# Patient Record
Sex: Female | Born: 1965
Health system: Southern US, Community
[De-identification: ages and names within clinical notes are randomized; demographics above are authoritative.]

## PROBLEM LIST (undated history)

## (undated) DIAGNOSIS — F329 Major depressive disorder, single episode, unspecified: Secondary | ICD-10-CM

## (undated) DIAGNOSIS — M1990 Unspecified osteoarthritis, unspecified site: Secondary | ICD-10-CM

## (undated) DIAGNOSIS — F32A Depression, unspecified: Secondary | ICD-10-CM

## (undated) HISTORY — PX: JOINT REPLACEMENT: SHX530

## (undated) HISTORY — PX: ABDOMINAL HYSTERECTOMY: SHX81

---

## 1997-12-01 ENCOUNTER — Ambulatory Visit (HOSPITAL_COMMUNITY): Admission: RE | Admit: 1997-12-01 | Discharge: 1997-12-01 | Payer: Self-pay | Admitting: Obstetrics and Gynecology

## 1999-02-07 ENCOUNTER — Other Ambulatory Visit: Admission: RE | Admit: 1999-02-07 | Discharge: 1999-02-07 | Payer: Self-pay | Admitting: Gynecology

## 1999-03-19 ENCOUNTER — Encounter (INDEPENDENT_AMBULATORY_CARE_PROVIDER_SITE_OTHER): Payer: Self-pay | Admitting: Specialist

## 1999-03-19 ENCOUNTER — Other Ambulatory Visit: Admission: RE | Admit: 1999-03-19 | Discharge: 1999-03-19 | Payer: Self-pay | Admitting: Gynecology

## 2000-09-11 ENCOUNTER — Other Ambulatory Visit: Admission: RE | Admit: 2000-09-11 | Discharge: 2000-09-11 | Payer: Self-pay | Admitting: *Deleted

## 2001-09-24 ENCOUNTER — Other Ambulatory Visit: Admission: RE | Admit: 2001-09-24 | Discharge: 2001-09-24 | Payer: Self-pay | Admitting: *Deleted

## 2002-01-12 ENCOUNTER — Encounter: Admission: RE | Admit: 2002-01-12 | Discharge: 2002-01-12 | Payer: Self-pay | Admitting: Family Medicine

## 2002-01-12 ENCOUNTER — Encounter: Payer: Self-pay | Admitting: Family Medicine

## 2002-09-15 ENCOUNTER — Other Ambulatory Visit: Admission: RE | Admit: 2002-09-15 | Discharge: 2002-09-15 | Payer: Self-pay | Admitting: *Deleted

## 2002-11-25 ENCOUNTER — Encounter: Payer: Self-pay | Admitting: Gastroenterology

## 2002-11-25 ENCOUNTER — Encounter: Admission: RE | Admit: 2002-11-25 | Discharge: 2002-11-25 | Payer: Self-pay | Admitting: Gastroenterology

## 2004-07-26 ENCOUNTER — Encounter: Admission: RE | Admit: 2004-07-26 | Discharge: 2004-07-26 | Payer: Self-pay | Admitting: Family Medicine

## 2004-08-02 ENCOUNTER — Encounter: Admission: RE | Admit: 2004-08-02 | Discharge: 2004-08-02 | Payer: Self-pay | Admitting: Family Medicine

## 2005-10-07 ENCOUNTER — Encounter: Admission: RE | Admit: 2005-10-07 | Discharge: 2005-10-07 | Payer: Self-pay | Admitting: Obstetrics and Gynecology

## 2009-01-12 ENCOUNTER — Encounter: Admission: RE | Admit: 2009-01-12 | Discharge: 2009-01-12 | Payer: Self-pay | Admitting: Obstetrics and Gynecology

## 2009-12-31 ENCOUNTER — Encounter: Admission: RE | Admit: 2009-12-31 | Discharge: 2009-12-31 | Payer: Self-pay | Admitting: Obstetrics and Gynecology

## 2010-12-20 ENCOUNTER — Other Ambulatory Visit: Payer: Self-pay | Admitting: Obstetrics and Gynecology

## 2010-12-20 DIAGNOSIS — Z1231 Encounter for screening mammogram for malignant neoplasm of breast: Secondary | ICD-10-CM

## 2011-01-03 ENCOUNTER — Ambulatory Visit: Payer: Self-pay

## 2011-01-09 ENCOUNTER — Other Ambulatory Visit: Payer: Self-pay | Admitting: Gastroenterology

## 2011-01-10 ENCOUNTER — Ambulatory Visit
Admission: RE | Admit: 2011-01-10 | Discharge: 2011-01-10 | Disposition: A | Discharge status: Home / Self Care | Payer: 59 | Source: Ambulatory Visit | Attending: Gastroenterology | Admitting: Gastroenterology

## 2011-01-10 MED ORDER — IOHEXOL 300 MG/ML  SOLN
100.0000 mL | Freq: Once | INTRAMUSCULAR | Status: AC | PRN
  Administered 2011-01-10: 100 mL via INTRAVENOUS

## 2011-01-13 ENCOUNTER — Other Ambulatory Visit: Payer: Self-pay | Admitting: Gastroenterology

## 2011-01-13 DIAGNOSIS — R1032 Left lower quadrant pain: Secondary | ICD-10-CM

## 2011-01-17 ENCOUNTER — Ambulatory Visit
Admission: RE | Admit: 2011-01-17 | Discharge: 2011-01-17 | Disposition: A | Discharge status: Home / Self Care | Payer: 59 | Source: Ambulatory Visit | Attending: Gastroenterology | Admitting: Gastroenterology

## 2011-01-17 DIAGNOSIS — R1032 Left lower quadrant pain: Secondary | ICD-10-CM

## 2011-01-31 ENCOUNTER — Ambulatory Visit
Admission: RE | Admit: 2011-01-31 | Discharge: 2011-01-31 | Disposition: A | Discharge status: Home / Self Care | Payer: 59 | Source: Ambulatory Visit | Attending: Obstetrics and Gynecology | Admitting: Obstetrics and Gynecology

## 2011-01-31 DIAGNOSIS — Z1231 Encounter for screening mammogram for malignant neoplasm of breast: Secondary | ICD-10-CM

## 2012-01-14 ENCOUNTER — Other Ambulatory Visit: Payer: Self-pay | Admitting: Obstetrics and Gynecology

## 2012-01-14 DIAGNOSIS — Z1231 Encounter for screening mammogram for malignant neoplasm of breast: Secondary | ICD-10-CM

## 2012-02-02 ENCOUNTER — Ambulatory Visit
Admission: RE | Admit: 2012-02-02 | Discharge: 2012-02-02 | Disposition: A | Discharge status: Home / Self Care | Payer: 59 | Source: Ambulatory Visit | Attending: Obstetrics and Gynecology | Admitting: Obstetrics and Gynecology

## 2012-02-02 DIAGNOSIS — Z1231 Encounter for screening mammogram for malignant neoplasm of breast: Secondary | ICD-10-CM

## 2012-02-09 ENCOUNTER — Other Ambulatory Visit: Payer: Self-pay | Admitting: Obstetrics and Gynecology

## 2012-02-09 DIAGNOSIS — R928 Other abnormal and inconclusive findings on diagnostic imaging of breast: Secondary | ICD-10-CM

## 2012-02-13 ENCOUNTER — Ambulatory Visit
Admission: RE | Admit: 2012-02-13 | Discharge: 2012-02-13 | Disposition: A | Discharge status: Home / Self Care | Payer: 59 | Source: Ambulatory Visit | Attending: Obstetrics and Gynecology | Admitting: Obstetrics and Gynecology

## 2012-02-13 DIAGNOSIS — R928 Other abnormal and inconclusive findings on diagnostic imaging of breast: Secondary | ICD-10-CM

## 2013-01-14 ENCOUNTER — Other Ambulatory Visit: Payer: Self-pay

## 2013-01-14 DIAGNOSIS — Z1231 Encounter for screening mammogram for malignant neoplasm of breast: Secondary | ICD-10-CM

## 2013-02-03 ENCOUNTER — Ambulatory Visit: Payer: 59

## 2013-03-04 ENCOUNTER — Other Ambulatory Visit: Payer: Self-pay | Admitting: Obstetrics and Gynecology

## 2013-03-04 ENCOUNTER — Ambulatory Visit
Admission: RE | Admit: 2013-03-04 | Discharge: 2013-03-04 | Disposition: A | Discharge status: Home / Self Care | Payer: 59 | Source: Ambulatory Visit

## 2013-03-04 DIAGNOSIS — Z1231 Encounter for screening mammogram for malignant neoplasm of breast: Secondary | ICD-10-CM

## 2013-03-04 DIAGNOSIS — N644 Mastodynia: Secondary | ICD-10-CM

## 2013-04-08 ENCOUNTER — Ambulatory Visit
Admission: RE | Admit: 2013-04-08 | Discharge: 2013-04-08 | Disposition: A | Discharge status: Home / Self Care | Payer: 59 | Source: Ambulatory Visit | Attending: Obstetrics and Gynecology | Admitting: Obstetrics and Gynecology

## 2013-04-08 ENCOUNTER — Other Ambulatory Visit: Payer: Self-pay | Admitting: Obstetrics and Gynecology

## 2013-04-08 DIAGNOSIS — N644 Mastodynia: Secondary | ICD-10-CM

## 2014-05-31 ENCOUNTER — Other Ambulatory Visit: Payer: Self-pay

## 2014-05-31 DIAGNOSIS — Z1231 Encounter for screening mammogram for malignant neoplasm of breast: Secondary | ICD-10-CM

## 2014-06-29 ENCOUNTER — Ambulatory Visit
Admission: RE | Admit: 2014-06-29 | Discharge: 2014-06-29 | Disposition: A | Discharge status: Home / Self Care | Payer: 59 | Source: Ambulatory Visit

## 2014-06-29 DIAGNOSIS — Z1231 Encounter for screening mammogram for malignant neoplasm of breast: Secondary | ICD-10-CM

## 2015-07-31 ENCOUNTER — Other Ambulatory Visit: Payer: Self-pay

## 2015-07-31 DIAGNOSIS — Z1231 Encounter for screening mammogram for malignant neoplasm of breast: Secondary | ICD-10-CM

## 2015-08-24 ENCOUNTER — Ambulatory Visit
Admission: RE | Admit: 2015-08-24 | Discharge: 2015-08-24 | Disposition: A | Discharge status: Home / Self Care | Payer: 59 | Source: Ambulatory Visit

## 2015-08-24 DIAGNOSIS — Z1231 Encounter for screening mammogram for malignant neoplasm of breast: Secondary | ICD-10-CM

## 2016-08-01 DIAGNOSIS — M5013 Cervical disc disorder with radiculopathy, cervicothoracic region: Secondary | ICD-10-CM | POA: Diagnosis not present

## 2016-08-01 DIAGNOSIS — M542 Cervicalgia: Secondary | ICD-10-CM | POA: Diagnosis not present

## 2016-08-01 DIAGNOSIS — S138XXA Sprain of joints and ligaments of other parts of neck, initial encounter: Secondary | ICD-10-CM | POA: Diagnosis not present

## 2016-09-03 DIAGNOSIS — M542 Cervicalgia: Secondary | ICD-10-CM | POA: Diagnosis not present

## 2016-09-03 DIAGNOSIS — M5013 Cervical disc disorder with radiculopathy, cervicothoracic region: Secondary | ICD-10-CM | POA: Diagnosis not present

## 2016-09-03 DIAGNOSIS — S138XXA Sprain of joints and ligaments of other parts of neck, initial encounter: Secondary | ICD-10-CM | POA: Diagnosis not present

## 2016-10-01 DIAGNOSIS — M5013 Cervical disc disorder with radiculopathy, cervicothoracic region: Secondary | ICD-10-CM | POA: Diagnosis not present

## 2016-10-01 DIAGNOSIS — S138XXA Sprain of joints and ligaments of other parts of neck, initial encounter: Secondary | ICD-10-CM | POA: Diagnosis not present

## 2016-10-01 DIAGNOSIS — M542 Cervicalgia: Secondary | ICD-10-CM | POA: Diagnosis not present

## 2016-11-05 DIAGNOSIS — S138XXA Sprain of joints and ligaments of other parts of neck, initial encounter: Secondary | ICD-10-CM | POA: Diagnosis not present

## 2016-11-05 DIAGNOSIS — M542 Cervicalgia: Secondary | ICD-10-CM | POA: Diagnosis not present

## 2016-11-05 DIAGNOSIS — M5013 Cervical disc disorder with radiculopathy, cervicothoracic region: Secondary | ICD-10-CM | POA: Diagnosis not present

## 2016-12-18 DIAGNOSIS — M542 Cervicalgia: Secondary | ICD-10-CM | POA: Diagnosis not present

## 2016-12-18 DIAGNOSIS — M5013 Cervical disc disorder with radiculopathy, cervicothoracic region: Secondary | ICD-10-CM | POA: Diagnosis not present

## 2016-12-18 DIAGNOSIS — S138XXA Sprain of joints and ligaments of other parts of neck, initial encounter: Secondary | ICD-10-CM | POA: Diagnosis not present

## 2017-01-16 DIAGNOSIS — S138XXA Sprain of joints and ligaments of other parts of neck, initial encounter: Secondary | ICD-10-CM | POA: Diagnosis not present

## 2017-01-16 DIAGNOSIS — M542 Cervicalgia: Secondary | ICD-10-CM | POA: Diagnosis not present

## 2017-01-16 DIAGNOSIS — M5013 Cervical disc disorder with radiculopathy, cervicothoracic region: Secondary | ICD-10-CM | POA: Diagnosis not present

## 2017-02-13 DIAGNOSIS — Z6823 Body mass index (BMI) 23.0-23.9, adult: Secondary | ICD-10-CM | POA: Diagnosis not present

## 2017-02-13 DIAGNOSIS — Z01419 Encounter for gynecological examination (general) (routine) without abnormal findings: Secondary | ICD-10-CM | POA: Diagnosis not present

## 2017-02-19 DIAGNOSIS — M542 Cervicalgia: Secondary | ICD-10-CM | POA: Diagnosis not present

## 2017-02-19 DIAGNOSIS — M5013 Cervical disc disorder with radiculopathy, cervicothoracic region: Secondary | ICD-10-CM | POA: Diagnosis not present

## 2017-02-19 DIAGNOSIS — S138XXA Sprain of joints and ligaments of other parts of neck, initial encounter: Secondary | ICD-10-CM | POA: Diagnosis not present

## 2017-03-04 ENCOUNTER — Other Ambulatory Visit: Payer: Self-pay | Admitting: Obstetrics and Gynecology

## 2017-03-04 DIAGNOSIS — Z1231 Encounter for screening mammogram for malignant neoplasm of breast: Secondary | ICD-10-CM

## 2017-03-26 ENCOUNTER — Ambulatory Visit
Admission: RE | Admit: 2017-03-26 | Discharge: 2017-03-26 | Disposition: A | Discharge status: Home / Self Care | Payer: 59 | Source: Ambulatory Visit | Attending: Obstetrics and Gynecology | Admitting: Obstetrics and Gynecology

## 2017-03-26 DIAGNOSIS — Z1231 Encounter for screening mammogram for malignant neoplasm of breast: Secondary | ICD-10-CM | POA: Diagnosis not present

## 2017-04-01 DIAGNOSIS — M542 Cervicalgia: Secondary | ICD-10-CM | POA: Diagnosis not present

## 2017-04-01 DIAGNOSIS — M5013 Cervical disc disorder with radiculopathy, cervicothoracic region: Secondary | ICD-10-CM | POA: Diagnosis not present

## 2017-04-01 DIAGNOSIS — S138XXA Sprain of joints and ligaments of other parts of neck, initial encounter: Secondary | ICD-10-CM | POA: Diagnosis not present

## 2017-04-29 DIAGNOSIS — M542 Cervicalgia: Secondary | ICD-10-CM | POA: Diagnosis not present

## 2017-04-29 DIAGNOSIS — M5013 Cervical disc disorder with radiculopathy, cervicothoracic region: Secondary | ICD-10-CM | POA: Diagnosis not present

## 2017-04-29 DIAGNOSIS — S138XXA Sprain of joints and ligaments of other parts of neck, initial encounter: Secondary | ICD-10-CM | POA: Diagnosis not present

## 2017-06-03 DIAGNOSIS — M542 Cervicalgia: Secondary | ICD-10-CM | POA: Diagnosis not present

## 2017-06-03 DIAGNOSIS — S138XXA Sprain of joints and ligaments of other parts of neck, initial encounter: Secondary | ICD-10-CM | POA: Diagnosis not present

## 2017-06-03 DIAGNOSIS — M5013 Cervical disc disorder with radiculopathy, cervicothoracic region: Secondary | ICD-10-CM | POA: Diagnosis not present

## 2017-07-20 DIAGNOSIS — M542 Cervicalgia: Secondary | ICD-10-CM | POA: Diagnosis not present

## 2017-07-20 DIAGNOSIS — S138XXA Sprain of joints and ligaments of other parts of neck, initial encounter: Secondary | ICD-10-CM | POA: Diagnosis not present

## 2017-07-20 DIAGNOSIS — M5013 Cervical disc disorder with radiculopathy, cervicothoracic region: Secondary | ICD-10-CM | POA: Diagnosis not present

## 2017-07-31 DIAGNOSIS — L720 Epidermal cyst: Secondary | ICD-10-CM | POA: Diagnosis not present

## 2017-07-31 DIAGNOSIS — D1801 Hemangioma of skin and subcutaneous tissue: Secondary | ICD-10-CM | POA: Diagnosis not present

## 2017-08-14 DIAGNOSIS — M542 Cervicalgia: Secondary | ICD-10-CM | POA: Diagnosis not present

## 2017-08-14 DIAGNOSIS — S138XXA Sprain of joints and ligaments of other parts of neck, initial encounter: Secondary | ICD-10-CM | POA: Diagnosis not present

## 2017-08-14 DIAGNOSIS — M5013 Cervical disc disorder with radiculopathy, cervicothoracic region: Secondary | ICD-10-CM | POA: Diagnosis not present

## 2017-09-11 DIAGNOSIS — M542 Cervicalgia: Secondary | ICD-10-CM | POA: Diagnosis not present

## 2017-09-11 DIAGNOSIS — M5013 Cervical disc disorder with radiculopathy, cervicothoracic region: Secondary | ICD-10-CM | POA: Diagnosis not present

## 2017-09-11 DIAGNOSIS — S138XXA Sprain of joints and ligaments of other parts of neck, initial encounter: Secondary | ICD-10-CM | POA: Diagnosis not present

## 2017-10-17 ENCOUNTER — Encounter (HOSPITAL_BASED_OUTPATIENT_CLINIC_OR_DEPARTMENT_OTHER): Payer: Self-pay | Admitting: Emergency Medicine

## 2017-10-17 ENCOUNTER — Other Ambulatory Visit: Payer: Self-pay

## 2017-10-17 ENCOUNTER — Emergency Department (HOSPITAL_BASED_OUTPATIENT_CLINIC_OR_DEPARTMENT_OTHER)
Admission: EM | Admit: 2017-10-17 | Discharge: 2017-10-17 | Disposition: A | Discharge status: Home / Self Care | Payer: 59 | Attending: Emergency Medicine | Admitting: Emergency Medicine

## 2017-10-17 ENCOUNTER — Emergency Department (HOSPITAL_BASED_OUTPATIENT_CLINIC_OR_DEPARTMENT_OTHER): Payer: 59

## 2017-10-17 DIAGNOSIS — Z79899 Other long term (current) drug therapy: Secondary | ICD-10-CM | POA: Diagnosis not present

## 2017-10-17 DIAGNOSIS — R1011 Right upper quadrant pain: Secondary | ICD-10-CM | POA: Diagnosis not present

## 2017-10-17 DIAGNOSIS — R110 Nausea: Secondary | ICD-10-CM | POA: Diagnosis not present

## 2017-10-17 DIAGNOSIS — R109 Unspecified abdominal pain: Secondary | ICD-10-CM

## 2017-10-17 HISTORY — DX: Depression, unspecified: F32.A

## 2017-10-17 HISTORY — DX: Major depressive disorder, single episode, unspecified: F32.9

## 2017-10-17 LAB — COMPREHENSIVE METABOLIC PANEL
ALBUMIN: 4.1 g/dL (ref 3.5–5.0)
ALT: 15 U/L (ref 14–54)
AST: 21 U/L (ref 15–41)
Alkaline Phosphatase: 54 U/L (ref 38–126)
Anion gap: 8 (ref 5–15)
BUN: 11 mg/dL (ref 6–20)
CHLORIDE: 106 mmol/L (ref 101–111)
CO2: 25 mmol/L (ref 22–32)
Calcium: 9.3 mg/dL (ref 8.9–10.3)
Creatinine, Ser: 0.6 mg/dL (ref 0.44–1.00)
GFR calc Af Amer: >60 mL/min (ref >60)
GFR calc non Af Amer: >60 mL/min (ref >60)
GLUCOSE: 95 mg/dL (ref 65–99)
POTASSIUM: 4.3 mmol/L (ref 3.5–5.1)
SODIUM: 139 mmol/L (ref 135–145)
Total Bilirubin: 0.7 mg/dL (ref 0.3–1.2)
Total Protein: 7.3 g/dL (ref 6.5–8.1)

## 2017-10-17 LAB — CBC
HEMATOCRIT: 37.2 % (ref 36.0–46.0)
Hemoglobin: 12.5 g/dL (ref 12.0–15.0)
MCH: 31.1 pg (ref 26.0–34.0)
MCHC: 33.6 g/dL (ref 30.0–36.0)
MCV: 92.5 fL (ref 78.0–100.0)
PLATELETS: 393 10*3/uL (ref 150–400)
RBC: 4.02 MIL/uL (ref 3.87–5.11)
RDW: 12.3 % (ref 11.5–15.5)
WBC: 8.7 10*3/uL (ref 4.0–10.5)

## 2017-10-17 LAB — URINALYSIS, MICROSCOPIC (REFLEX)

## 2017-10-17 LAB — URINALYSIS, ROUTINE W REFLEX MICROSCOPIC
Bilirubin Urine: NEGATIVE
Glucose, UA: NEGATIVE mg/dL
KETONES UR: NEGATIVE mg/dL
LEUKOCYTES UA: NEGATIVE
NITRITE: NEGATIVE
PROTEIN: NEGATIVE mg/dL
Specific Gravity, Urine: <1.005 — ABNORMAL LOW (ref 1.005–1.030)
pH: 7 (ref 5.0–8.0)

## 2017-10-17 MED ORDER — SODIUM CHLORIDE 0.9 % IV BOLUS
1000.0000 mL | Freq: Once | INTRAVENOUS | Status: AC
  Administered 2017-10-17: 1000 mL via INTRAVENOUS

## 2017-10-17 MED ORDER — CYCLOBENZAPRINE HCL 5 MG PO TABS: 5.0000 mg | ORAL_TABLET | Freq: Every evening | ORAL | 0 refills | Status: DC | PRN

## 2017-10-17 MED ORDER — ONDANSETRON HCL 4 MG/2ML IJ SOLN
4.0000 mg | Freq: Once | INTRAMUSCULAR | Status: AC
  Administered 2017-10-17: 4 mg via INTRAVENOUS
  Filled 2017-10-17: qty 2

## 2017-10-17 NOTE — ED Triage Notes (Signed)
Patient states that she woke up about 2 days ago with Right flank pain and nausea. REports that yesterday she was better and they travelled back from [facility] - patient states that today she started to have the pain again to her right flank. Patient denies any Nausea

## 2017-10-17 NOTE — Discharge Instructions (Signed)
Take ibuprofen 3 times a day with meals.  Do not take other anti-inflammatories at the same time open (Advil, Motrin, naproxen, Aleve). You may supplement with Tylenol if you need further pain control. Use heating pads to help control your pain. Use muscle relaxer as needed. Have caution, as this can make you tired or groggy. Do not drive or operate heavy machinery while taking this medication.  Follow up with your primary care doctor for reevaluation.  Return to the ER if you develop fevers, severe worsening pain, or any new or concerning symptoms.

## 2017-10-17 NOTE — ED Notes (Signed)
Rx x 1 given for flexeril. Pt d/c home with family

## 2017-10-17 NOTE — ED Notes (Signed)
Pt to CT at this time.

## 2017-10-17 NOTE — ED Provider Notes (Signed)
[facility] Provider Note   CSN: [id number] Arrival date & time: 10/17/17  1053     History   Chief Complaint Chief Complaint  Patient presents with  . Flank Pain    HPI Margaret Burns is a 52 y.o. female presenting for evaluation of right-sided flank pain.  Patient states her pain began 2 days ago.  It is located on the right flank.  It does not radiate.  She has no pain on the left side.  Initially it was managed with Tylenol and ibuprofen, but it has worsened today.  Movement makes the pain worse, nothing makes it better.  No increased pain with urination.  Patient reports pain was worse when trying to have a bowel movement, although she was straining at the time.  She has associated nausea without vomiting.  Tolerating p.o. without difficulty.  She has a history of a kidney stone 10 years ago, but believes this feels different.  No other abdominal issues or previous surgeries.  She denies fevers, chills, chest pain, shortness breath, cough, anterior abdominal pain, or abnormal bowel movements.  She denies dysuria, hematuria, urinary frequency.  She states she has no medical problems and does not take medications daily.  HPI  Past Medical History:  Diagnosis Date  . Depression     There are no active problems to display for this patient.   Past Surgical History:  Procedure Laterality Date  . ABDOMINAL HYSTERECTOMY       OB History   None      Home Medications    Prior to Admission medications   Medication Sig Start Date End Date Taking? Authorizing Provider  buPROPion (WELLBUTRIN) 75 MG tablet Take 75 mg by mouth 2 (two) times daily.   Yes [provider]  cyclobenzaprine (FLEXERIL) 5 MG tablet Take 1 tablet (5 mg total) by mouth at bedtime as needed for muscle spasms. 10/17/17   Demica Zook, PA-C    Family History Family History  Problem Relation Age of Onset  . Breast cancer Neg Hx     Social History Social  History   Tobacco Use  . Smoking status: Never Smoker  . Smokeless tobacco: Never Used  Substance Use Topics  . Alcohol use: Not Currently    Frequency: Never    Comment: occ  . Drug use: Never     Allergies   Patient has no known allergies.   Review of Systems Review of Systems  Constitutional: Negative for chills and fever.  Respiratory: Negative for cough, chest tightness and shortness of breath.   Cardiovascular: Negative for chest pain.  Gastrointestinal: Positive for nausea. Negative for abdominal pain and vomiting.  Genitourinary: Positive for flank pain. Negative for dysuria, frequency and hematuria.  Musculoskeletal: Negative for myalgias.  Skin: Negative for rash and wound.  Allergic/Immunologic: Negative for immunocompromised state.  Neurological: Negative for headaches.  Hematological: Does not bruise/bleed easily.  Psychiatric/Behavioral: Negative for confusion.     Physical Exam Updated Vital Signs BP 112/72 (BP Location: Right Arm)   Pulse 65   Temp 98.1 F (36.7 C) (Oral)   Resp 18   Ht 5\' 2"  (1.575 m)   Wt 57.2 kg (126 lb)   SpO2 100%   BMI 23.05 kg/m   Physical Exam  Constitutional: She is oriented to person, place, and time. She appears well-developed and well-nourished. No distress.  HENT:  Head: Normocephalic and atraumatic.  Eyes: Pupils are equal, round, and reactive to light. Conjunctivae and  EOM are normal.  Neck: Normal range of motion. Neck supple.  Cardiovascular: Normal rate, regular rhythm and intact distal pulses.  Pulmonary/Chest: Effort normal and breath sounds normal. No respiratory distress. She has no wheezes.  Clear lung sounds in all fields.  Abdominal: Soft. Bowel sounds are normal. She exhibits no distension and no mass. There is no rigidity, no guarding and no CVA tenderness.  Tenderness to palpation of right flank/side.  No CVA tenderness.  No obvious deformity.  Anterior abdomen without tenderness palpation.  It is  soft without rigidity, guarding, or distention.  Musculoskeletal: Normal range of motion.  Tenderness of right side/back.  No pain elsewhere.  No tenderness palpation of midline spine.  Patient ambulated without difficulty.  Neurological: She is alert and oriented to person, place, and time.  Skin: Skin is warm and dry.  Psychiatric: She has a normal mood and affect.  Nursing note and vitals reviewed.    ED Treatments / Results  Labs (all labs ordered are listed, but only abnormal results are displayed) Labs Reviewed  URINALYSIS, ROUTINE W REFLEX MICROSCOPIC - Abnormal; Notable for the following components:      Result Value   Color, Urine STRAW (*)    Specific Gravity, Urine <1.005 (*)    Hgb urine dipstick SMALL (*)    All other components within normal limits  URINALYSIS, MICROSCOPIC (REFLEX) - Abnormal; Notable for the following components:   Bacteria, UA MANY (*)    Squamous Epithelial / LPF 0-5 (*)    All other components within normal limits  CBC  COMPREHENSIVE METABOLIC PANEL    EKG None  Radiology Ct Renal Stone Study  Result Date: 10/17/2017 CLINICAL DATA:  52 year old female with acute RIGHT flank and abdominal pain for 4 days. History of urinary calculi. EXAM: CT ABDOMEN AND PELVIS WITHOUT CONTRAST TECHNIQUE: Multidetector CT imaging of the abdomen and pelvis was performed following the standard protocol without IV contrast. COMPARISON:  01/10/2011 and prior CTs FINDINGS: Please note that parenchymal abnormalities may be missed without intravenous contrast. Lower chest: No acute abnormality. Hepatobiliary: Hypodense hepatic lesions are unchanged 2012 compatible with a benign process. No other hepatic or gallbladder abnormalities identified. No biliary dilatation. Pancreas: Unremarkable Spleen: Unremarkable Adrenals/Urinary Tract: The kidneys are unremarkable on this noncontrast study. No urinary calculi or hydronephrosis identified. The adrenal glands and bladder are  unremarkable. Stomach/Bowel: Stomach is within normal limits. No evidence of bowel wall thickening, distention, or inflammatory changes. Vascular/Lymphatic: No significant vascular findings are present. No enlarged abdominal or pelvic lymph nodes. Reproductive: Status post hysterectomy. No adnexal masses. Other: No ascites, pneumoperitoneum or definite collection. Musculoskeletal: No acute or significant osseous findings. IMPRESSION: No acute or significant abnormalities. Electronically Signed   By: Margarette Canada M.D.   On: 10/17/2017 13:57    Procedures Procedures (including critical care time)  Medications Ordered in ED Medications  ondansetron (ZOFRAN) injection 4 mg (4 mg Intravenous Given 10/17/17 1219)  sodium chloride 0.9 % bolus 1,000 mL (0 mLs Intravenous Stopped 10/17/17 1445)     Initial Impression / Assessment and Plan / ED Course  I have reviewed the triage vital signs and the nursing notes.  Pertinent labs & imaging results that were available during my care of the patient were reviewed by me and considered in my medical decision making (see chart for details).     Patient presenting for evaluation of right-sided flank/thigh pain.  Physical exam reassuring, patient is afebrile not tachycardic.  She appears nontoxic.  She does  not appear to be in significant pain.  Urine shows small blood and many bacteria without nitrates or leuks.  Doubt UTI, as patient without any urinary symptoms.  As she has blood in ua and right-sided pain, will obtain labs and CT renal.  Labs reassuring, no sign of kidney injury.  No leukocytosis.  CT without acute findings including kidney stone or obvious intra-abdominal infection.  Discussed findings with patient.  Discussed treatment with Tylenol and ibuprofen. Flexeril as needed.  Follow-up with primary care as needed.  At this time, patient present for discharge.  Return precautions given.  Patient states she understands and agrees to plan.   Final  Clinical Impressions(s) / ED Diagnoses   Final diagnoses:  Right flank pain    ED Discharge Orders        Ordered    cyclobenzaprine (FLEXERIL) 5 MG tablet  At bedtime PRN     10/17/17 1439       Lamberto Dinapoli, PA-C 10/17/17 2026    Little, Wenda Overland, MD 10/19/17 [id number]

## 2017-10-23 DIAGNOSIS — R109 Unspecified abdominal pain: Secondary | ICD-10-CM | POA: Diagnosis not present

## 2017-10-28 DIAGNOSIS — M5013 Cervical disc disorder with radiculopathy, cervicothoracic region: Secondary | ICD-10-CM | POA: Diagnosis not present

## 2017-10-28 DIAGNOSIS — M542 Cervicalgia: Secondary | ICD-10-CM | POA: Diagnosis not present

## 2017-10-28 DIAGNOSIS — S138XXA Sprain of joints and ligaments of other parts of neck, initial encounter: Secondary | ICD-10-CM | POA: Diagnosis not present

## 2017-10-30 DIAGNOSIS — L918 Other hypertrophic disorders of the skin: Secondary | ICD-10-CM | POA: Diagnosis not present

## 2017-10-30 DIAGNOSIS — L821 Other seborrheic keratosis: Secondary | ICD-10-CM | POA: Diagnosis not present

## 2017-10-30 DIAGNOSIS — D2262 Melanocytic nevi of left upper limb, including shoulder: Secondary | ICD-10-CM | POA: Diagnosis not present

## 2017-10-30 DIAGNOSIS — D2261 Melanocytic nevi of right upper limb, including shoulder: Secondary | ICD-10-CM | POA: Diagnosis not present

## 2017-11-25 DIAGNOSIS — M542 Cervicalgia: Secondary | ICD-10-CM | POA: Diagnosis not present

## 2017-11-25 DIAGNOSIS — S138XXA Sprain of joints and ligaments of other parts of neck, initial encounter: Secondary | ICD-10-CM | POA: Diagnosis not present

## 2017-11-25 DIAGNOSIS — M5013 Cervical disc disorder with radiculopathy, cervicothoracic region: Secondary | ICD-10-CM | POA: Diagnosis not present

## 2017-11-30 IMAGING — MG 2D DIGITAL SCREENING BILATERAL MAMMOGRAM WITH CAD AND ADJUNCT TO
9 of 12 series · 9 of 28 positions shown · non-contrast
Comparison: Previous exam(s).

CLINICAL DATA: Screening.

EXAM:
2D DIGITAL SCREENING BILATERAL MAMMOGRAM WITH CAD AND ADJUNCT TOMO

[R MLO synth-2D]
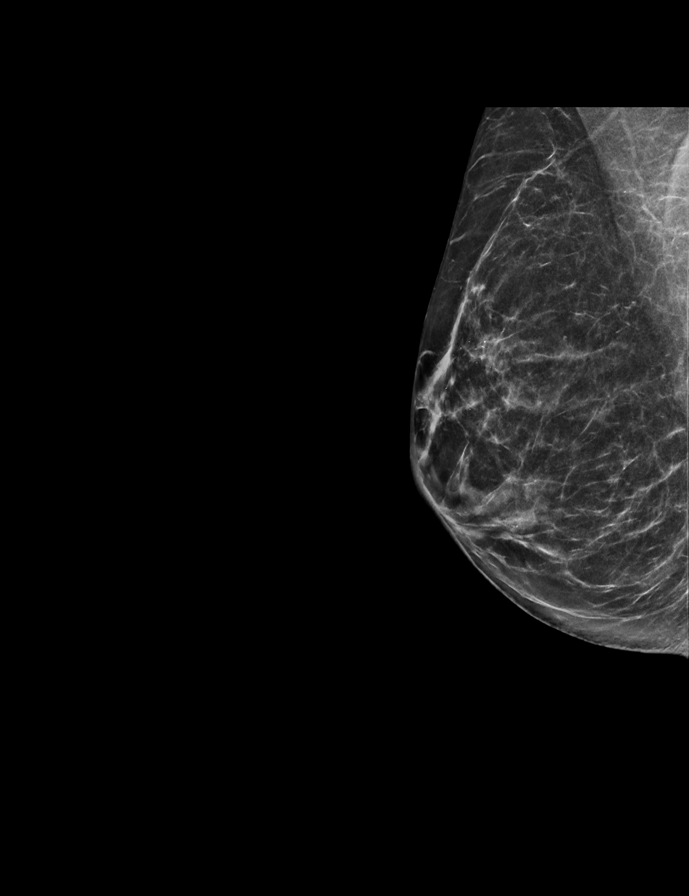

[L MLO]
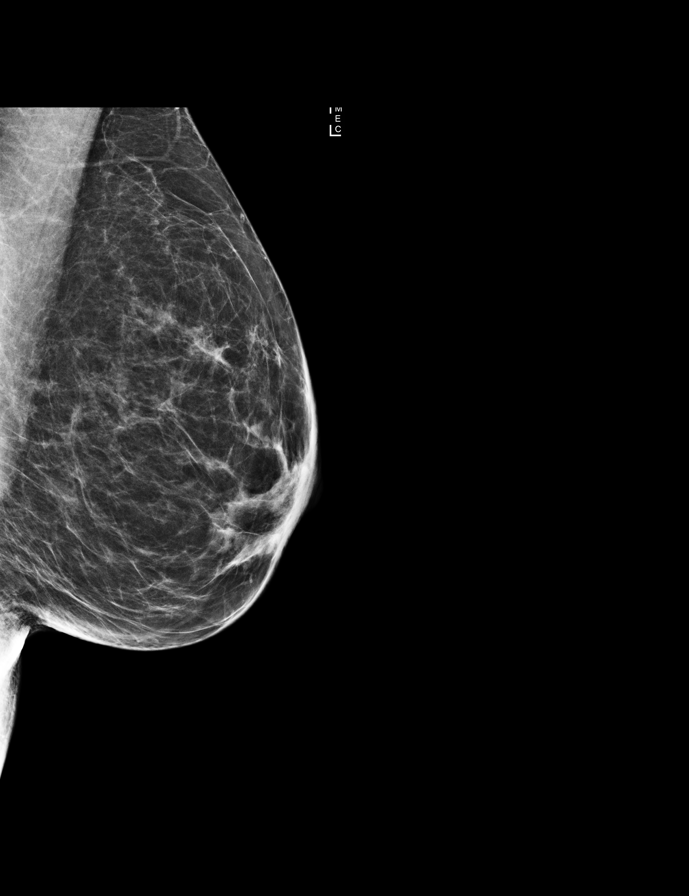

[L CC synth-2D]
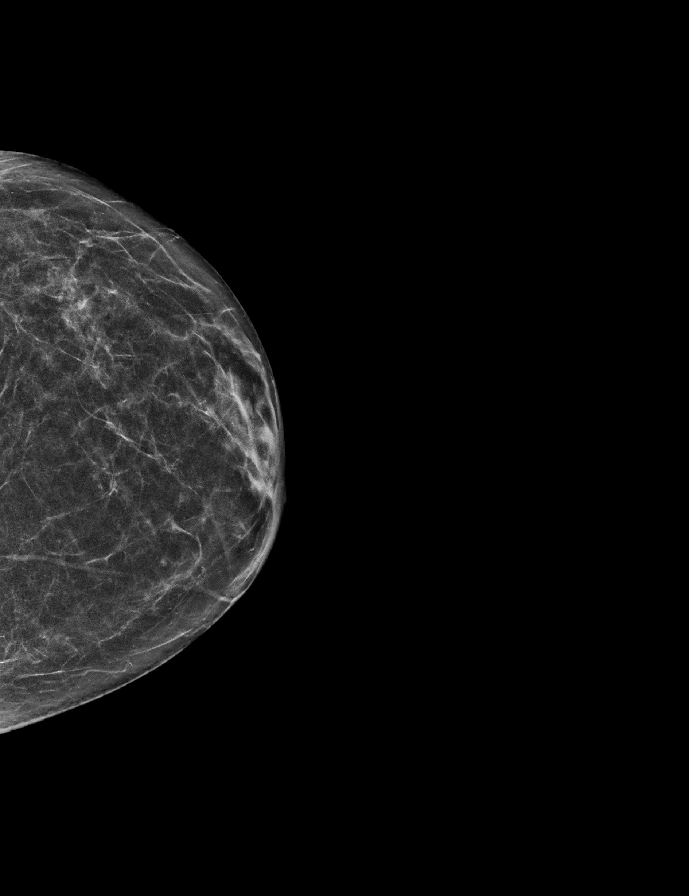

[L MLO synth-2D]
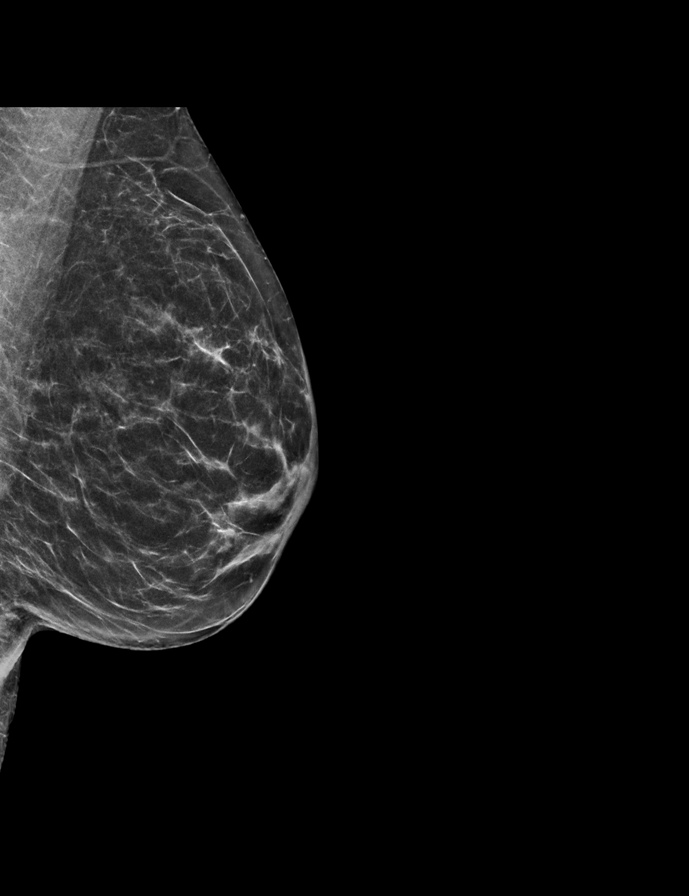

[R CC synth-2D]
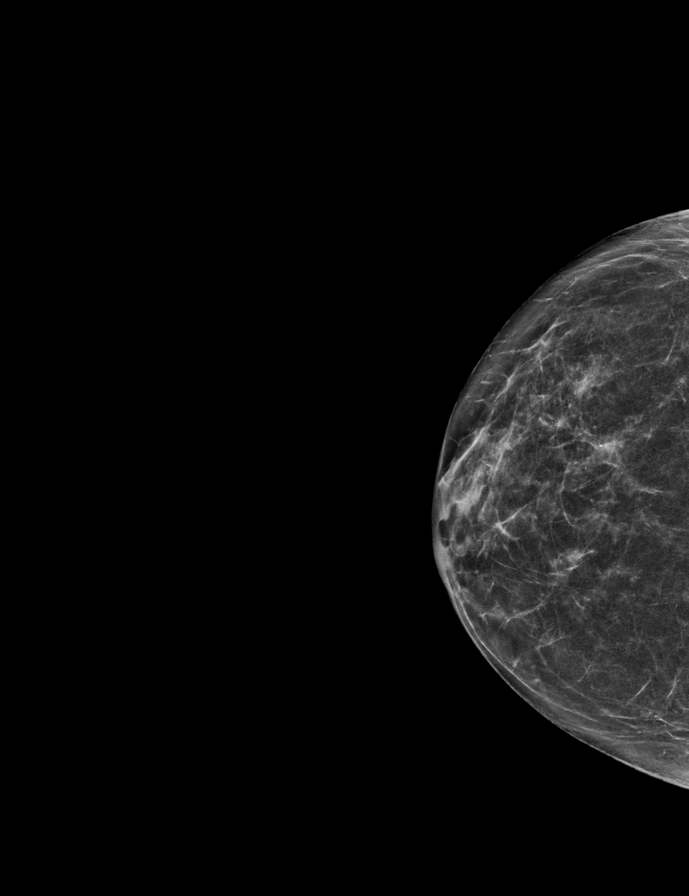

[R CC]
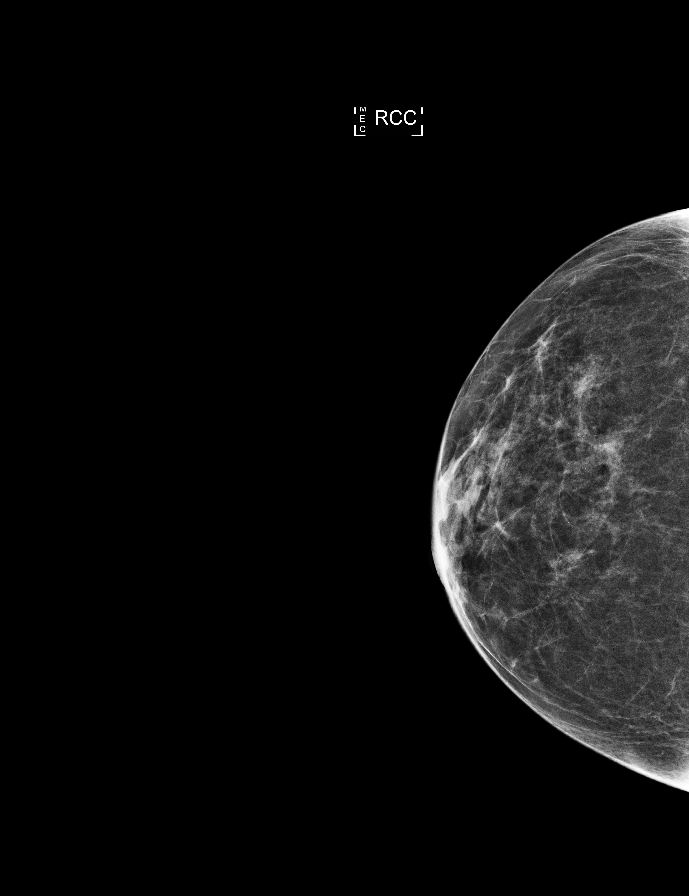

[R MLO]
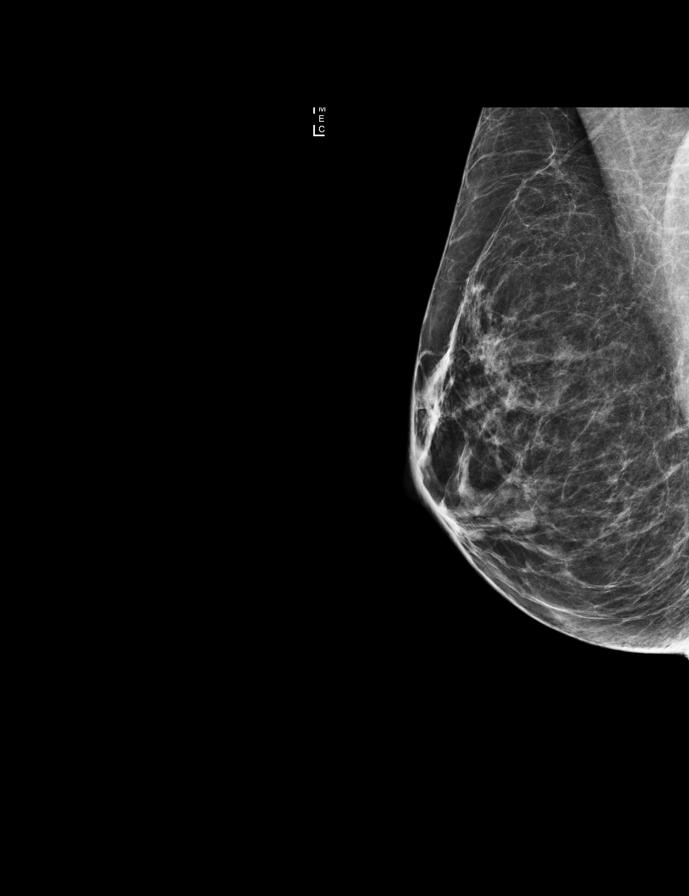

[L CC]
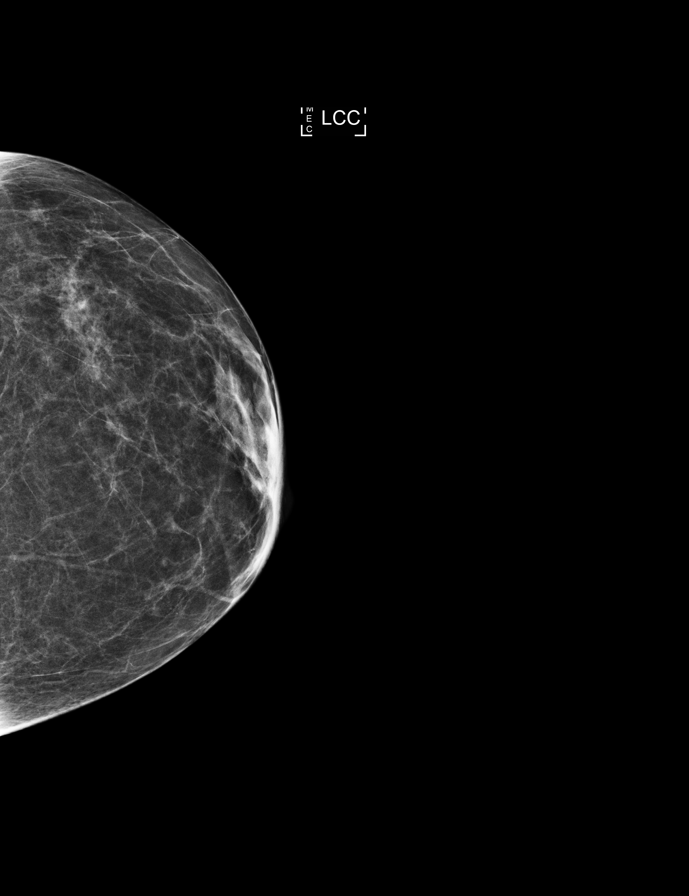

[L CC tomo · tomo slice 25/50.0]
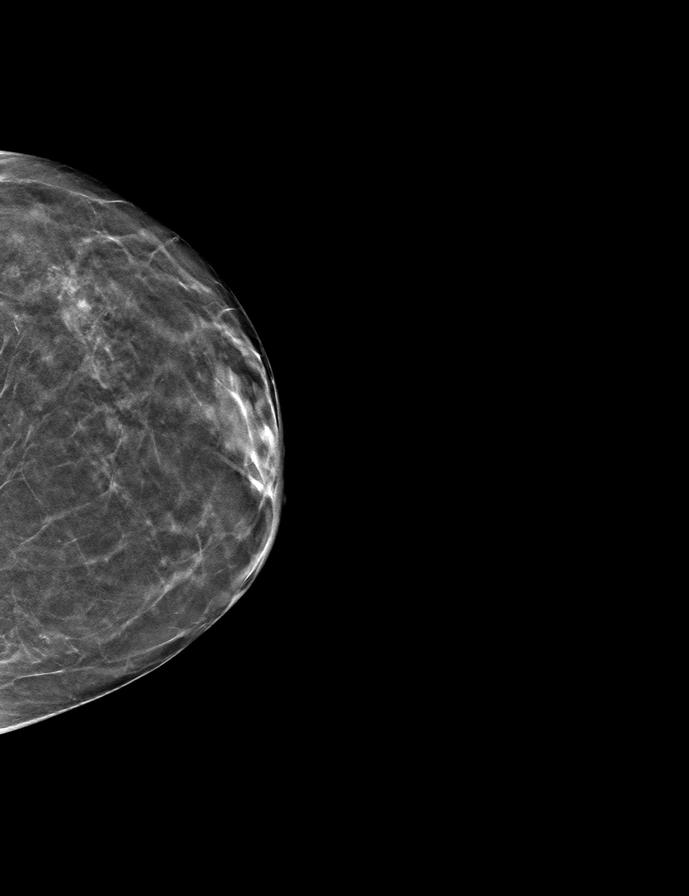

[9 of 28 positions shown; findings below may reference images not displayed]

ACR Breast Density Category c: The breast tissue is heterogeneously
dense, which may obscure small masses.
FINDINGS: There are no findings suspicious for malignancy. Images were
processed with CAD.
IMPRESSION: No mammographic evidence of malignancy. A result letter of this
screening mammogram will be mailed directly to the patient.

RECOMMENDATION:
Screening mammogram in one year. (Code:TN-0-K4T)

BI-RADS CATEGORY  1: Negative.

## 2017-12-23 DIAGNOSIS — S138XXA Sprain of joints and ligaments of other parts of neck, initial encounter: Secondary | ICD-10-CM | POA: Diagnosis not present

## 2017-12-23 DIAGNOSIS — M542 Cervicalgia: Secondary | ICD-10-CM | POA: Diagnosis not present

## 2017-12-23 DIAGNOSIS — M5013 Cervical disc disorder with radiculopathy, cervicothoracic region: Secondary | ICD-10-CM | POA: Diagnosis not present

## 2018-01-29 DIAGNOSIS — M5013 Cervical disc disorder with radiculopathy, cervicothoracic region: Secondary | ICD-10-CM | POA: Diagnosis not present

## 2018-01-29 DIAGNOSIS — S138XXA Sprain of joints and ligaments of other parts of neck, initial encounter: Secondary | ICD-10-CM | POA: Diagnosis not present

## 2018-01-29 DIAGNOSIS — M542 Cervicalgia: Secondary | ICD-10-CM | POA: Diagnosis not present

## 2018-02-26 DIAGNOSIS — M5013 Cervical disc disorder with radiculopathy, cervicothoracic region: Secondary | ICD-10-CM | POA: Diagnosis not present

## 2018-02-26 DIAGNOSIS — S138XXA Sprain of joints and ligaments of other parts of neck, initial encounter: Secondary | ICD-10-CM | POA: Diagnosis not present

## 2018-02-26 DIAGNOSIS — M542 Cervicalgia: Secondary | ICD-10-CM | POA: Diagnosis not present

## 2018-03-17 DIAGNOSIS — Z01419 Encounter for gynecological examination (general) (routine) without abnormal findings: Secondary | ICD-10-CM | POA: Diagnosis not present

## 2018-03-17 DIAGNOSIS — Z6823 Body mass index (BMI) 23.0-23.9, adult: Secondary | ICD-10-CM | POA: Diagnosis not present

## 2018-03-17 DIAGNOSIS — R319 Hematuria, unspecified: Secondary | ICD-10-CM | POA: Diagnosis not present

## 2018-04-01 DIAGNOSIS — M1611 Unilateral primary osteoarthritis, right hip: Secondary | ICD-10-CM | POA: Diagnosis not present

## 2018-04-01 DIAGNOSIS — M67911 Unspecified disorder of synovium and tendon, right shoulder: Secondary | ICD-10-CM | POA: Diagnosis not present

## 2018-04-02 DIAGNOSIS — S138XXA Sprain of joints and ligaments of other parts of neck, initial encounter: Secondary | ICD-10-CM | POA: Diagnosis not present

## 2018-04-02 DIAGNOSIS — M542 Cervicalgia: Secondary | ICD-10-CM | POA: Diagnosis not present

## 2018-04-02 DIAGNOSIS — Z1231 Encounter for screening mammogram for malignant neoplasm of breast: Secondary | ICD-10-CM | POA: Diagnosis not present

## 2018-04-02 DIAGNOSIS — M5013 Cervical disc disorder with radiculopathy, cervicothoracic region: Secondary | ICD-10-CM | POA: Diagnosis not present

## 2018-04-06 DIAGNOSIS — M1611 Unilateral primary osteoarthritis, right hip: Secondary | ICD-10-CM | POA: Diagnosis not present

## 2018-04-13 DIAGNOSIS — S138XXA Sprain of joints and ligaments of other parts of neck, initial encounter: Secondary | ICD-10-CM | POA: Diagnosis not present

## 2018-04-13 DIAGNOSIS — M5013 Cervical disc disorder with radiculopathy, cervicothoracic region: Secondary | ICD-10-CM | POA: Diagnosis not present

## 2018-04-13 DIAGNOSIS — M542 Cervicalgia: Secondary | ICD-10-CM | POA: Diagnosis not present

## 2018-04-20 DIAGNOSIS — M1611 Unilateral primary osteoarthritis, right hip: Secondary | ICD-10-CM | POA: Diagnosis not present

## 2018-04-26 DIAGNOSIS — M542 Cervicalgia: Secondary | ICD-10-CM | POA: Diagnosis not present

## 2018-04-26 DIAGNOSIS — M5013 Cervical disc disorder with radiculopathy, cervicothoracic region: Secondary | ICD-10-CM | POA: Diagnosis not present

## 2018-04-26 DIAGNOSIS — S138XXA Sprain of joints and ligaments of other parts of neck, initial encounter: Secondary | ICD-10-CM | POA: Diagnosis not present

## 2018-05-07 DIAGNOSIS — Z23 Encounter for immunization: Secondary | ICD-10-CM | POA: Diagnosis not present

## 2018-05-07 DIAGNOSIS — Z0000 Encounter for general adult medical examination without abnormal findings: Secondary | ICD-10-CM | POA: Diagnosis not present

## 2018-05-10 DIAGNOSIS — M5013 Cervical disc disorder with radiculopathy, cervicothoracic region: Secondary | ICD-10-CM | POA: Diagnosis not present

## 2018-05-10 DIAGNOSIS — M542 Cervicalgia: Secondary | ICD-10-CM | POA: Diagnosis not present

## 2018-05-10 DIAGNOSIS — S138XXA Sprain of joints and ligaments of other parts of neck, initial encounter: Secondary | ICD-10-CM | POA: Diagnosis not present

## 2018-05-24 DIAGNOSIS — Z111 Encounter for screening for respiratory tuberculosis: Secondary | ICD-10-CM | POA: Diagnosis not present

## 2018-06-07 DIAGNOSIS — S138XXA Sprain of joints and ligaments of other parts of neck, initial encounter: Secondary | ICD-10-CM | POA: Diagnosis not present

## 2018-06-07 DIAGNOSIS — M5013 Cervical disc disorder with radiculopathy, cervicothoracic region: Secondary | ICD-10-CM | POA: Diagnosis not present

## 2018-06-07 DIAGNOSIS — M542 Cervicalgia: Secondary | ICD-10-CM | POA: Diagnosis not present

## 2018-06-21 DIAGNOSIS — Z808 Family history of malignant neoplasm of other organs or systems: Secondary | ICD-10-CM | POA: Diagnosis not present

## 2018-06-21 DIAGNOSIS — Z803 Family history of malignant neoplasm of breast: Secondary | ICD-10-CM | POA: Diagnosis not present

## 2018-06-21 DIAGNOSIS — N951 Menopausal and female climacteric states: Secondary | ICD-10-CM | POA: Diagnosis not present

## 2018-06-23 IMAGING — CT CT RENAL STONE PROTOCOL
2 of 4 series · 17 of 46 positions shown, 19 images · non-contrast
Comparison: 01/10/2011 and prior CTs

CLINICAL DATA: 51-year-old female with acute RIGHT flank and
abdominal pain for 4 days. History of urinary calculi.

EXAM:
CT ABDOMEN AND PELVIS WITHOUT CONTRAST
TECHNIQUE: Multidetector CT imaging of the abdomen and pelvis was performed
following the standard protocol without IV contrast.

[Series 2: axial st · axial · 0.69mm/px · z∈[-366,-1]mm · 14 of 81 slices shown, 16 images]
[im 4/81  soft-tissue]
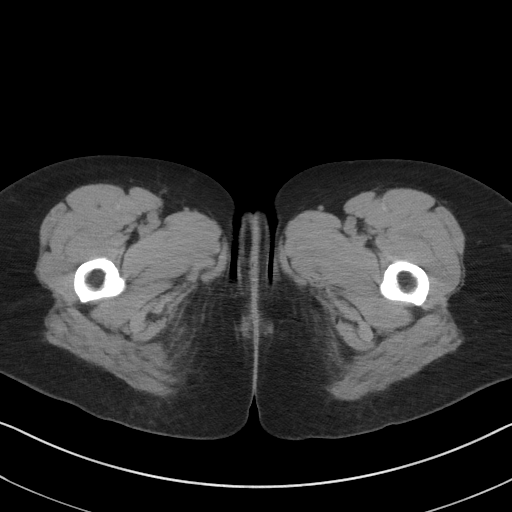
[im 4/81  bone]
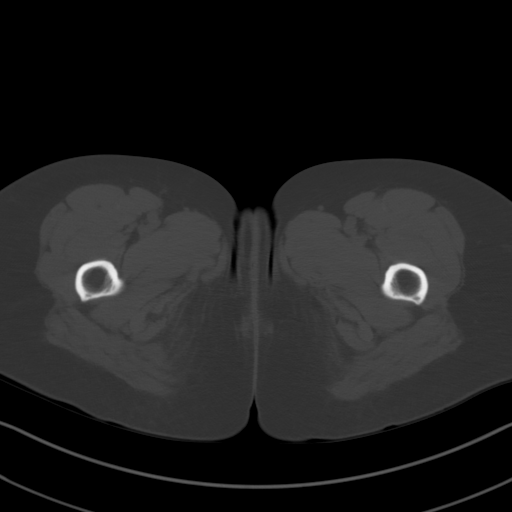
[im 10/81  soft-tissue]
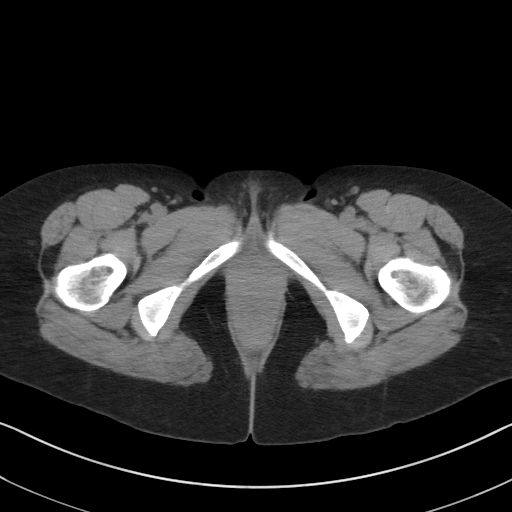
[im 16/81  soft-tissue]
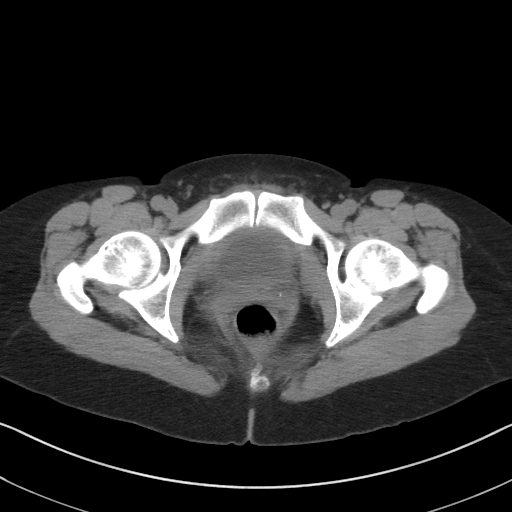
[im 22/81  soft-tissue]
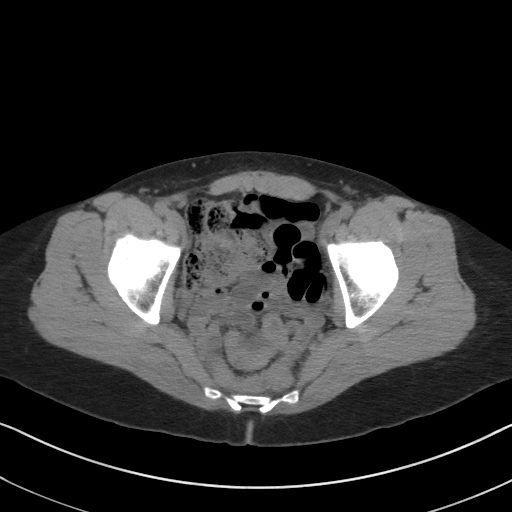
[im 28/81  soft-tissue]
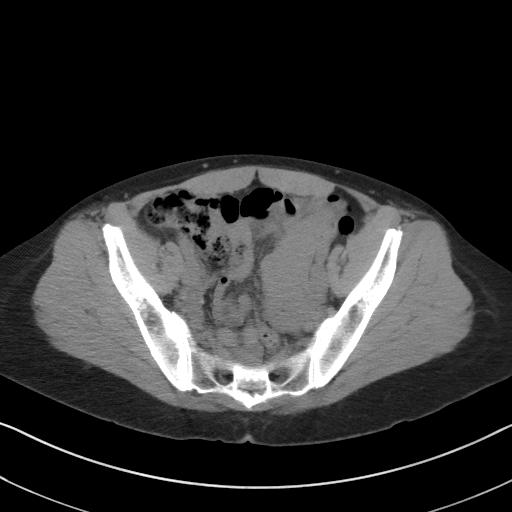
[im 31/81  soft-tissue]
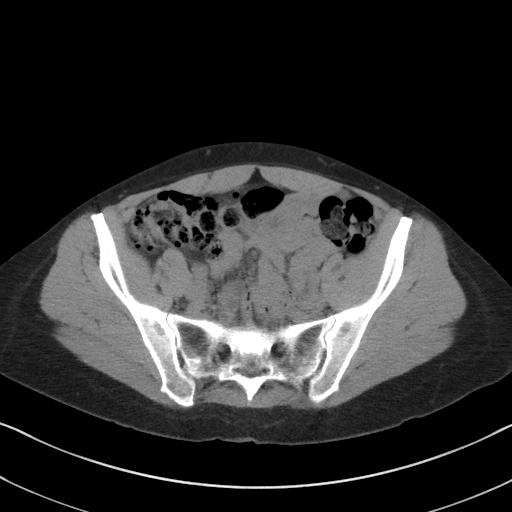
[im 37/81  soft-tissue]
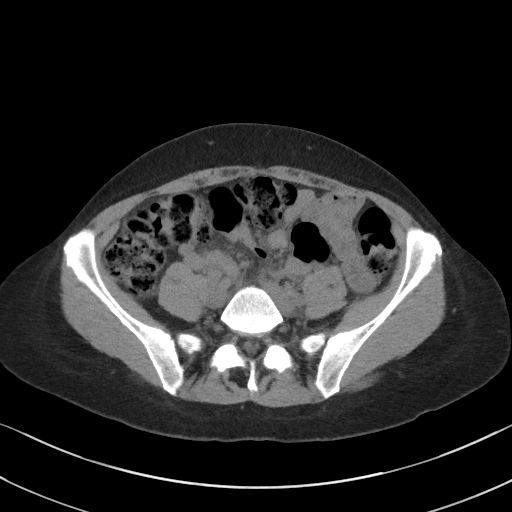
[im 44/81  soft-tissue]
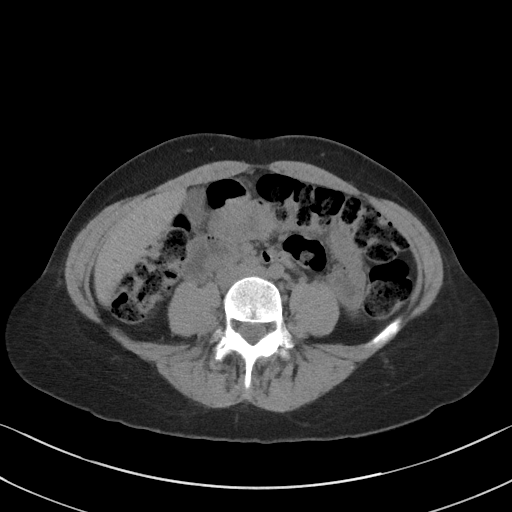
[im 50/81  soft-tissue]
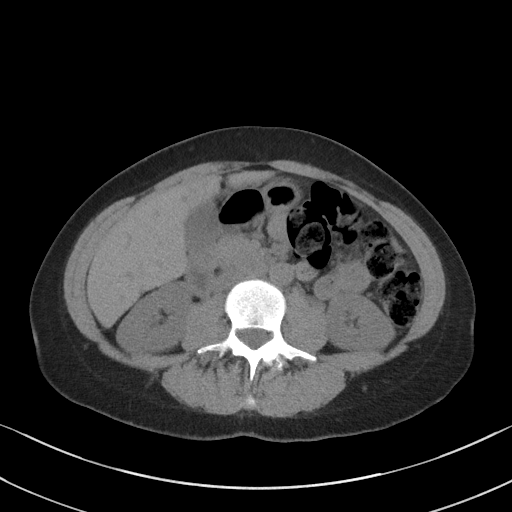
[im 50/81  bone]
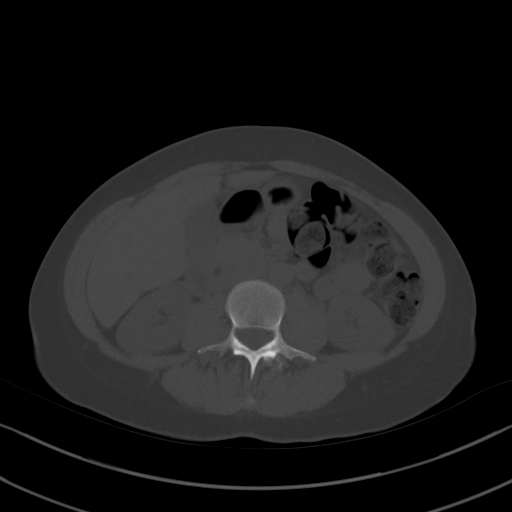
[im 53/81  soft-tissue]
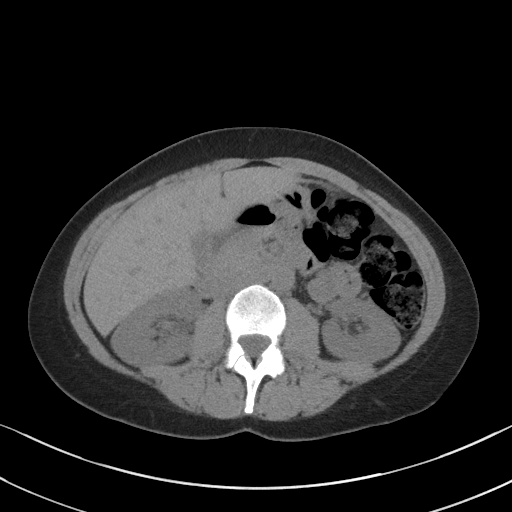
[im 59/81  soft-tissue]
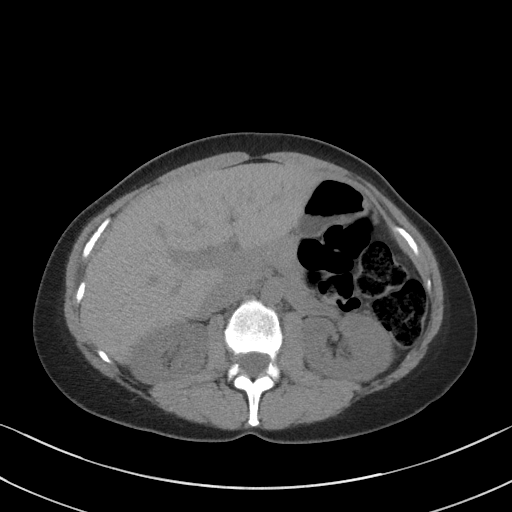
[im 65/81  soft-tissue]
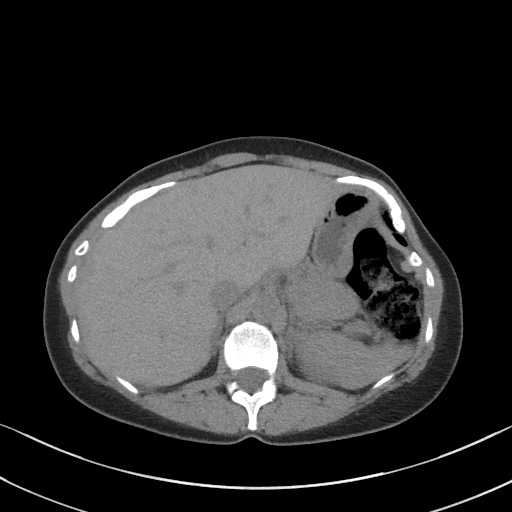
[im 71/81  soft-tissue]
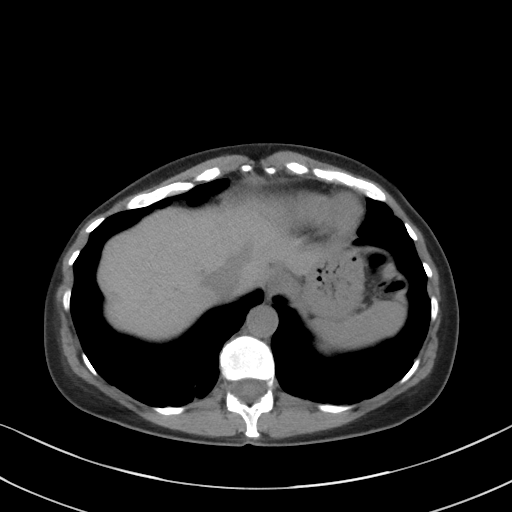
[im 77/81  soft-tissue]
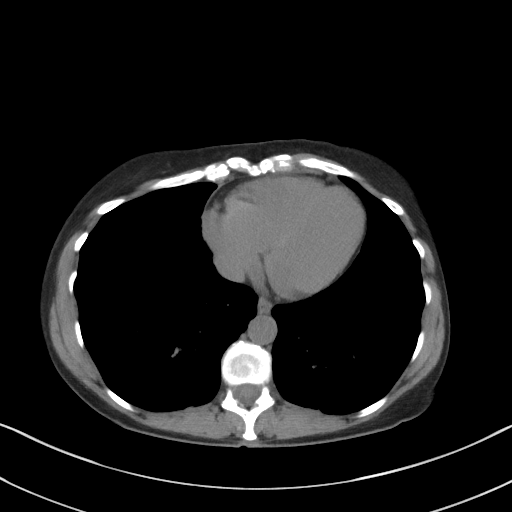

[Series 5: coronal st · coronal · 0.81mm/px · 3 of 78 slices shown]
[im 26/78  soft-tissue]
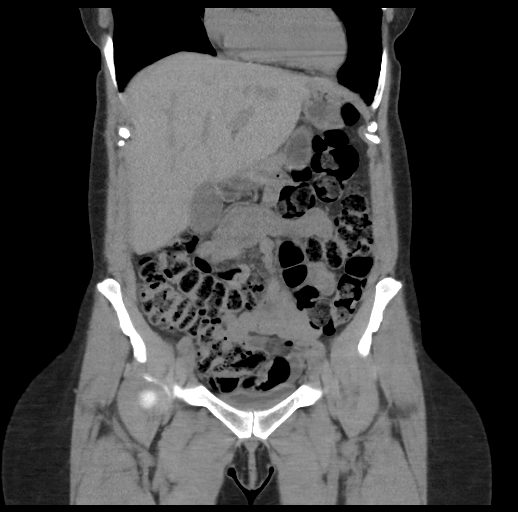
[im 35/78  soft-tissue]
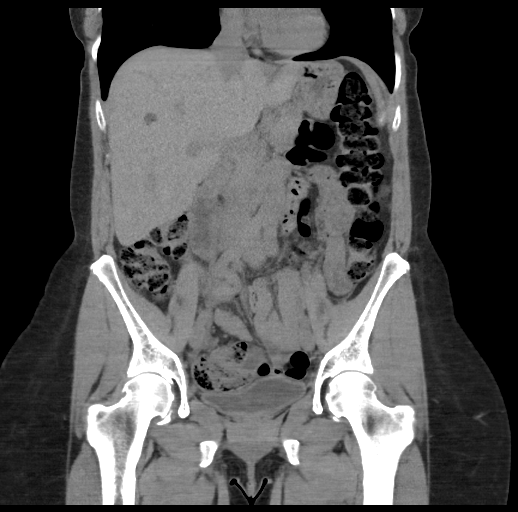
[im 43/78  soft-tissue]
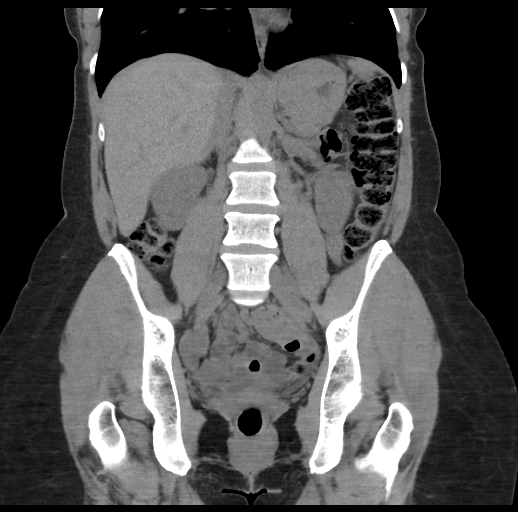

[17 of 46 positions shown; findings below may reference images not displayed]

FINDINGS: Please note that parenchymal abnormalities may be missed without
intravenous contrast.

Lower chest: No acute abnormality.

Hepatobiliary: Hypodense hepatic lesions are unchanged 7167
compatible with a benign process. No other hepatic or gallbladder
abnormalities identified. No biliary dilatation.

Pancreas: Unremarkable

Spleen: Unremarkable

Adrenals/Urinary Tract: The kidneys are unremarkable on this
noncontrast study. No urinary calculi or hydronephrosis identified.
The adrenal glands and bladder are unremarkable.

Stomach/Bowel: Stomach is within normal limits. No evidence of bowel
wall thickening, distention, or inflammatory changes.

Vascular/Lymphatic: No significant vascular findings are present. No
enlarged abdominal or pelvic lymph nodes.

Reproductive: Status post hysterectomy. No adnexal masses.

Other: No ascites, pneumoperitoneum or definite collection.

Musculoskeletal: No acute or significant osseous findings.
IMPRESSION: No acute or significant abnormalities.

## 2018-07-05 DIAGNOSIS — M5013 Cervical disc disorder with radiculopathy, cervicothoracic region: Secondary | ICD-10-CM | POA: Diagnosis not present

## 2018-07-05 DIAGNOSIS — M542 Cervicalgia: Secondary | ICD-10-CM | POA: Diagnosis not present

## 2018-07-05 DIAGNOSIS — S138XXA Sprain of joints and ligaments of other parts of neck, initial encounter: Secondary | ICD-10-CM | POA: Diagnosis not present

## 2018-07-22 DIAGNOSIS — S138XXA Sprain of joints and ligaments of other parts of neck, initial encounter: Secondary | ICD-10-CM | POA: Diagnosis not present

## 2018-07-22 DIAGNOSIS — M5013 Cervical disc disorder with radiculopathy, cervicothoracic region: Secondary | ICD-10-CM | POA: Diagnosis not present

## 2018-07-22 DIAGNOSIS — M542 Cervicalgia: Secondary | ICD-10-CM | POA: Diagnosis not present

## 2018-08-27 DIAGNOSIS — S138XXA Sprain of joints and ligaments of other parts of neck, initial encounter: Secondary | ICD-10-CM | POA: Diagnosis not present

## 2018-08-27 DIAGNOSIS — M5013 Cervical disc disorder with radiculopathy, cervicothoracic region: Secondary | ICD-10-CM | POA: Diagnosis not present

## 2018-08-27 DIAGNOSIS — M542 Cervicalgia: Secondary | ICD-10-CM | POA: Diagnosis not present

## 2018-09-10 DIAGNOSIS — M1611 Unilateral primary osteoarthritis, right hip: Secondary | ICD-10-CM | POA: Diagnosis not present

## 2018-09-17 DIAGNOSIS — M5013 Cervical disc disorder with radiculopathy, cervicothoracic region: Secondary | ICD-10-CM | POA: Diagnosis not present

## 2018-09-17 DIAGNOSIS — S138XXD Sprain of joints and ligaments of other parts of neck, subsequent encounter: Secondary | ICD-10-CM | POA: Diagnosis not present

## 2018-09-17 DIAGNOSIS — M542 Cervicalgia: Secondary | ICD-10-CM | POA: Diagnosis not present

## 2018-09-21 DIAGNOSIS — Z1211 Encounter for screening for malignant neoplasm of colon: Secondary | ICD-10-CM | POA: Diagnosis not present

## 2018-09-21 DIAGNOSIS — Z86010 Personal history of colonic polyps: Secondary | ICD-10-CM | POA: Diagnosis not present

## 2018-09-21 DIAGNOSIS — K5904 Chronic idiopathic constipation: Secondary | ICD-10-CM | POA: Diagnosis not present

## 2018-11-23 DIAGNOSIS — M25551 Pain in right hip: Secondary | ICD-10-CM | POA: Diagnosis not present

## 2019-06-27 ENCOUNTER — Other Ambulatory Visit: Payer: Self-pay

## 2019-06-27 DIAGNOSIS — Z20822 Contact with and (suspected) exposure to covid-19: Secondary | ICD-10-CM

## 2019-06-28 LAB — NOVEL CORONAVIRUS, NAA: SARS-CoV-2, NAA: NOT DETECTED

## 2020-07-10 ENCOUNTER — Ambulatory Visit: Payer: Self-pay | Admitting: General Surgery

## 2020-07-10 DIAGNOSIS — N6092 Unspecified benign mammary dysplasia of left breast: Secondary | ICD-10-CM

## 2020-07-25 ENCOUNTER — Other Ambulatory Visit: Payer: Self-pay | Admitting: General Surgery

## 2020-07-25 ENCOUNTER — Ambulatory Visit
Admission: RE | Admit: 2020-07-25 | Discharge: 2020-07-25 | Disposition: A | Discharge status: Home / Self Care | Payer: 59 | Source: Ambulatory Visit | Attending: General Surgery | Admitting: General Surgery

## 2020-07-25 DIAGNOSIS — N6092 Unspecified benign mammary dysplasia of left breast: Secondary | ICD-10-CM

## 2020-07-25 MED ORDER — GADOBENATE DIMEGLUMINE 529 MG/ML IV SOLN: 5.0000 mL | Freq: Once | INTRAVENOUS | Status: DC | PRN

## 2020-07-25 MED ORDER — GADOBUTROL 1 MMOL/ML IV SOLN
6.0000 mL | Freq: Once | INTRAVENOUS | Status: AC | PRN
  Administered 2020-07-25: 6 mL via INTRAVENOUS

## 2020-08-24 ENCOUNTER — Encounter (HOSPITAL_BASED_OUTPATIENT_CLINIC_OR_DEPARTMENT_OTHER): Payer: Self-pay | Admitting: General Surgery

## 2020-08-28 ENCOUNTER — Other Ambulatory Visit (HOSPITAL_COMMUNITY): Payer: 59

## 2020-08-28 ENCOUNTER — Other Ambulatory Visit (HOSPITAL_COMMUNITY)
Admission: RE | Admit: 2020-08-28 | Discharge: 2020-08-28 | Disposition: A | Discharge status: Home / Self Care | Payer: 59 | Source: Ambulatory Visit | Attending: General Surgery | Admitting: General Surgery

## 2020-08-28 DIAGNOSIS — Z20822 Contact with and (suspected) exposure to covid-19: Secondary | ICD-10-CM | POA: Diagnosis not present

## 2020-08-28 DIAGNOSIS — Z01812 Encounter for preprocedural laboratory examination: Secondary | ICD-10-CM | POA: Diagnosis not present

## 2020-08-28 LAB — SARS CORONAVIRUS 2 (TAT 6-24 HRS): SARS Coronavirus 2: NEGATIVE

## 2020-08-30 NOTE — Progress Notes (Signed)

## 2020-08-31 ENCOUNTER — Encounter (HOSPITAL_BASED_OUTPATIENT_CLINIC_OR_DEPARTMENT_OTHER): Payer: Self-pay | Admitting: General Surgery

## 2020-08-31 ENCOUNTER — Other Ambulatory Visit: Payer: Self-pay

## 2020-08-31 ENCOUNTER — Ambulatory Visit (HOSPITAL_BASED_OUTPATIENT_CLINIC_OR_DEPARTMENT_OTHER)
Admission: RE | Admit: 2020-08-31 | Discharge: 2020-08-31 | Disposition: A | Discharge status: Home / Self Care | Payer: 59 | Attending: General Surgery | Admitting: General Surgery

## 2020-08-31 ENCOUNTER — Encounter (HOSPITAL_BASED_OUTPATIENT_CLINIC_OR_DEPARTMENT_OTHER)
Admission: RE | Disposition: A | Discharge status: Home / Self Care | Payer: Self-pay | Source: Home / Self Care | Attending: General Surgery

## 2020-08-31 ENCOUNTER — Ambulatory Visit (HOSPITAL_BASED_OUTPATIENT_CLINIC_OR_DEPARTMENT_OTHER): Payer: 59 | Admitting: Certified Registered"

## 2020-08-31 DIAGNOSIS — Z803 Family history of malignant neoplasm of breast: Secondary | ICD-10-CM | POA: Insufficient documentation

## 2020-08-31 DIAGNOSIS — N6022 Fibroadenosis of left breast: Secondary | ICD-10-CM | POA: Insufficient documentation

## 2020-08-31 DIAGNOSIS — Z87891 Personal history of nicotine dependence: Secondary | ICD-10-CM | POA: Insufficient documentation

## 2020-08-31 DIAGNOSIS — N6092 Unspecified benign mammary dysplasia of left breast: Secondary | ICD-10-CM | POA: Diagnosis present

## 2020-08-31 DIAGNOSIS — Z79899 Other long term (current) drug therapy: Secondary | ICD-10-CM | POA: Diagnosis not present

## 2020-08-31 HISTORY — PX: BREAST LUMPECTOMY WITH RADIOACTIVE SEED LOCALIZATION: SHX6424

## 2020-08-31 HISTORY — DX: Unspecified osteoarthritis, unspecified site: M19.90

## 2020-08-31 SURGERY — BREAST LUMPECTOMY WITH RADIOACTIVE SEED LOCALIZATION
Anesthesia: General | Site: Breast | Laterality: Left

## 2020-08-31 MED ORDER — CELECOXIB 200 MG PO CAPS
200.0000 mg | ORAL_CAPSULE | ORAL | Status: AC
  Administered 2020-08-31: 200 mg via ORAL

## 2020-08-31 MED ORDER — HYDROMORPHONE HCL 1 MG/ML IJ SOLN
0.2500 mg | INTRAMUSCULAR | Status: DC | PRN
  Administered 2020-08-31: 0.5 mg via INTRAVENOUS

## 2020-08-31 MED ORDER — CEFAZOLIN SODIUM-DEXTROSE 2-4 GM/100ML-% IV SOLN
INTRAVENOUS | Status: AC
  Filled 2020-08-31: qty 100

## 2020-08-31 MED ORDER — MEPERIDINE HCL 25 MG/ML IJ SOLN: 6.2500 mg | INTRAMUSCULAR | Status: DC | PRN

## 2020-08-31 MED ORDER — MIDAZOLAM HCL 2 MG/2ML IJ SOLN
INTRAMUSCULAR | Status: AC
  Filled 2020-08-31: qty 2

## 2020-08-31 MED ORDER — FENTANYL CITRATE (PF) 100 MCG/2ML IJ SOLN
INTRAMUSCULAR | Status: DC | PRN
  Administered 2020-08-31 (×2): 25 ug via INTRAVENOUS

## 2020-08-31 MED ORDER — AMISULPRIDE (ANTIEMETIC) 5 MG/2ML IV SOLN: 10.0000 mg | Freq: Once | INTRAVENOUS | Status: DC | PRN

## 2020-08-31 MED ORDER — CEFAZOLIN SODIUM-DEXTROSE 2-4 GM/100ML-% IV SOLN
2.0000 g | INTRAVENOUS | Status: AC
  Administered 2020-08-31: 2 g via INTRAVENOUS

## 2020-08-31 MED ORDER — HYDROCODONE-ACETAMINOPHEN 5-325 MG PO TABS: 1.0000 | ORAL_TABLET | Freq: Four times a day (QID) | ORAL | 0 refills | Status: DC | PRN

## 2020-08-31 MED ORDER — BUPIVACAINE HCL 0.25 % IJ SOLN
INTRAMUSCULAR | Status: DC | PRN
  Administered 2020-08-31: 20 mL

## 2020-08-31 MED ORDER — PROMETHAZINE HCL 25 MG/ML IJ SOLN: 6.2500 mg | INTRAMUSCULAR | Status: DC | PRN

## 2020-08-31 MED ORDER — DEXAMETHASONE SODIUM PHOSPHATE 10 MG/ML IJ SOLN
INTRAMUSCULAR | Status: DC | PRN
  Administered 2020-08-31: 5 mg via INTRAVENOUS

## 2020-08-31 MED ORDER — LACTATED RINGERS IV SOLN: INTRAVENOUS | Status: DC

## 2020-08-31 MED ORDER — ACETAMINOPHEN 500 MG PO TABS
ORAL_TABLET | ORAL | Status: AC
  Filled 2020-08-31: qty 2

## 2020-08-31 MED ORDER — 0.9 % SODIUM CHLORIDE (POUR BTL) OPTIME
TOPICAL | Status: DC | PRN
  Administered 2020-08-31: 1000 mL

## 2020-08-31 MED ORDER — MIDAZOLAM HCL 5 MG/5ML IJ SOLN
INTRAMUSCULAR | Status: DC | PRN
  Administered 2020-08-31: 2 mg via INTRAVENOUS

## 2020-08-31 MED ORDER — LIDOCAINE HCL (CARDIAC) PF 100 MG/5ML IV SOSY
PREFILLED_SYRINGE | INTRAVENOUS | Status: DC | PRN
  Administered 2020-08-31: 60 mg via INTRAVENOUS

## 2020-08-31 MED ORDER — GABAPENTIN 300 MG PO CAPS
ORAL_CAPSULE | ORAL | Status: AC
  Filled 2020-08-31: qty 1

## 2020-08-31 MED ORDER — GABAPENTIN 300 MG PO CAPS
300.0000 mg | ORAL_CAPSULE | ORAL | Status: AC
  Administered 2020-08-31: 300 mg via ORAL

## 2020-08-31 MED ORDER — CHLORHEXIDINE GLUCONATE CLOTH 2 % EX PADS: 6.0000 | MEDICATED_PAD | Freq: Once | CUTANEOUS | Status: DC

## 2020-08-31 MED ORDER — ONDANSETRON HCL 4 MG/2ML IJ SOLN
INTRAMUSCULAR | Status: AC
  Filled 2020-08-31: qty 2

## 2020-08-31 MED ORDER — LIDOCAINE 2% (20 MG/ML) 5 ML SYRINGE
INTRAMUSCULAR | Status: AC
  Filled 2020-08-31: qty 5

## 2020-08-31 MED ORDER — OXYCODONE HCL 5 MG PO TABS: 5.0000 mg | ORAL_TABLET | Freq: Once | ORAL | Status: DC | PRN

## 2020-08-31 MED ORDER — PROPOFOL 10 MG/ML IV BOLUS
INTRAVENOUS | Status: DC | PRN
  Administered 2020-08-31: 200 mg via INTRAVENOUS

## 2020-08-31 MED ORDER — HYDROMORPHONE HCL 1 MG/ML IJ SOLN
INTRAMUSCULAR | Status: AC
  Filled 2020-08-31: qty 0.5

## 2020-08-31 MED ORDER — OXYCODONE HCL 5 MG/5ML PO SOLN
5.0000 mg | Freq: Once | ORAL | Status: DC | PRN
Start: 2020-08-31 — End: 2020-08-31

## 2020-08-31 MED ORDER — ONDANSETRON HCL 4 MG/2ML IJ SOLN
INTRAMUSCULAR | Status: DC | PRN
  Administered 2020-08-31: 4 mg via INTRAVENOUS

## 2020-08-31 MED ORDER — CELECOXIB 200 MG PO CAPS
ORAL_CAPSULE | ORAL | Status: AC
  Filled 2020-08-31: qty 1

## 2020-08-31 MED ORDER — FENTANYL CITRATE (PF) 100 MCG/2ML IJ SOLN
INTRAMUSCULAR | Status: AC
  Filled 2020-08-31: qty 2

## 2020-08-31 MED ORDER — ACETAMINOPHEN 500 MG PO TABS
1000.0000 mg | ORAL_TABLET | ORAL | Status: AC
  Administered 2020-08-31: 1000 mg via ORAL

## 2020-08-31 SURGICAL SUPPLY — 42 items
ADH SKN CLS APL DERMABOND .7 (GAUZE/BANDAGES/DRESSINGS) ×1
APL PRP STRL LF DISP 70% ISPRP (MISCELLANEOUS) ×1
APPLIER CLIP 9.375 MED OPEN (MISCELLANEOUS)
APR CLP MED 9.3 20 MLT OPN (MISCELLANEOUS)
BLADE SURG 15 STRL LF DISP TIS (BLADE) ×1 IMPLANT
BLADE SURG 15 STRL SS (BLADE) ×2
CANISTER SUC SOCK COL 7IN (MISCELLANEOUS) ×2 IMPLANT
CANISTER SUCT 1200ML W/VALVE (MISCELLANEOUS) ×2 IMPLANT
CHLORAPREP W/TINT 26 (MISCELLANEOUS) ×2 IMPLANT
CLIP APPLIE 9.375 MED OPEN (MISCELLANEOUS) IMPLANT
COVER BACK TABLE 60X90IN (DRAPES) ×2 IMPLANT
COVER MAYO STAND STRL (DRAPES) ×2 IMPLANT
COVER PROBE W GEL 5X96 (DRAPES) ×2 IMPLANT
COVER WAND RF STERILE (DRAPES) IMPLANT
DECANTER SPIKE VIAL GLASS SM (MISCELLANEOUS) IMPLANT
DERMABOND ADVANCED (GAUZE/BANDAGES/DRESSINGS) ×1
DERMABOND ADVANCED .7 DNX12 (GAUZE/BANDAGES/DRESSINGS) ×1 IMPLANT
DRAPE LAPAROSCOPIC ABDOMINAL (DRAPES) ×2 IMPLANT
DRAPE UTILITY XL STRL (DRAPES) ×2 IMPLANT
ELECT COATED BLADE 2.86 ST (ELECTRODE) ×2 IMPLANT
ELECT REM PT RETURN 9FT ADLT (ELECTROSURGICAL) ×2
ELECTRODE REM PT RTRN 9FT ADLT (ELECTROSURGICAL) ×1 IMPLANT
GLOVE SURG ENC MOIS LTX SZ7.5 (GLOVE) ×4 IMPLANT
GOWN STRL REUS W/ TWL LRG LVL3 (GOWN DISPOSABLE) ×2 IMPLANT
GOWN STRL REUS W/TWL LRG LVL3 (GOWN DISPOSABLE) ×4
ILLUMINATOR WAVEGUIDE N/F (MISCELLANEOUS) IMPLANT
KIT MARKER MARGIN INK (KITS) ×2 IMPLANT
LIGHT WAVEGUIDE WIDE FLAT (MISCELLANEOUS) IMPLANT
NEEDLE HYPO 25X1 1.5 SAFETY (NEEDLE) IMPLANT
NS IRRIG 1000ML POUR BTL (IV SOLUTION) IMPLANT
PACK BASIN DAY SURGERY FS (CUSTOM PROCEDURE TRAY) ×2 IMPLANT
PENCIL SMOKE EVACUATOR (MISCELLANEOUS) ×2 IMPLANT
SLEEVE SCD COMPRESS KNEE MED (MISCELLANEOUS) ×2 IMPLANT
SPONGE LAP 18X18 RF (DISPOSABLE) ×2 IMPLANT
SUT MON AB 4-0 PC3 18 (SUTURE) ×2 IMPLANT
SUT SILK 2 0 SH (SUTURE) IMPLANT
SUT VICRYL 3-0 CR8 SH (SUTURE) ×2 IMPLANT
SYR CONTROL 10ML LL (SYRINGE) IMPLANT
TOWEL GREEN STERILE FF (TOWEL DISPOSABLE) ×2 IMPLANT
TRAY FAXITRON CT DISP (TRAY / TRAY PROCEDURE) IMPLANT
TUBE CONNECTING 20X1/4 (TUBING) ×2 IMPLANT
YANKAUER SUCT BULB TIP NO VENT (SUCTIONS) IMPLANT

## 2020-08-31 NOTE — Discharge Instructions (Signed)
°  Post Anesthesia Home Care Instructions  Activity: Get plenty of rest for the remainder of the day. A responsible individual must stay with you for 24 hours following the procedure.  For the next 24 hours, DO NOT: -Drive a car -Paediatric nurse -Drink alcoholic beverages -Take any medication unless instructed by your physician -Make any legal decisions or sign important papers.  Meals: Start with liquid foods such as gelatin or soup. Progress to regular foods as tolerated. Avoid greasy, spicy, heavy foods. If nausea and/or vomiting occur, drink only clear liquids until the nausea and/or vomiting subsides. Call your physician if vomiting continues.  Special Instructions/Symptoms: Your throat may feel dry or sore from the anesthesia or the breathing tube placed in your throat during surgery. If this causes discomfort, gargle with warm salt water. The discomfort should disappear within 24 hours.  If you had a scopolamine patch placed behind your ear for the management of post- operative nausea and/or vomiting:  1. The medication in the patch is effective for 72 hours, after which it should be removed.  Wrap patch in a tissue and discard in the trash. Wash hands thoroughly with soap and water. 2. You may remove the patch earlier than 72 hours if you experience unpleasant side effects which may include dry mouth, dizziness or visual disturbances. 3. Avoid touching the patch. Wash your hands with soap and water after contact with the patch.    No tylenol or ibuprofen until after 6:15pm tonight if needed.

## 2020-08-31 NOTE — Op Note (Signed)
08/31/2020  2:53 PM  PATIENT:  Margaret Burns  55 y.o. female  PRE-OPERATIVE DIAGNOSIS:  LEFT BREAST ATYPICAL DUCTAL HYPERPLASIA  POST-OPERATIVE DIAGNOSIS:  LEFT BREAST ATYPICAL DUCTAL HYPERPLASIA  PROCEDURE:  Procedure(s): RADIOACTIVE SEED GUIDED LEFT BREAST LUMPECTOMY (Left)  SURGEON:  Surgeon(s) and Role:    * Jovita Kussmaul, MD - Primary  PHYSICIAN ASSISTANT:   ASSISTANTS: none   ANESTHESIA:   local and general  EBL:  minimal   BLOOD ADMINISTERED:none  DRAINS: none   LOCAL MEDICATIONS USED:  MARCAINE     SPECIMEN:  Source of Specimen:  left breast tissue  DISPOSITION OF SPECIMEN:  PATHOLOGY  COUNTS:  YES  TOURNIQUET:  * No tourniquets in log *  DICTATION: .Dragon Dictation   After informed consent was obtained the patient was brought to the operating room and placed in the supine position on the operating table.  After adequate induction of general anesthesia the patient's left breast was prepped with ChloraPrep, allowed to dry, and draped in usual sterile manner.  An appropriate timeout was performed.  Previously an I-125 seed was placed in the outer aspect of the left breast to mark an area of atypical ductal hyperplasia.  The neoprobe was set to I-125 in the area of radioactivity was readily identified.  The area around this was infiltrated with quarter percent Marcaine.  A curvilinear incision was made with a 15 blade knife along the outer edge of the areola of the left breast.  The incision was carried through the skin and subcutaneous tissue sharply with the electrocautery.  Dissection was then carried through the breast tissue under the direction of the neoprobe.  Once I more closely approached the radioactive seed I then removed a circular portion of breast tissue sharply around the radioactive seed while checking the area of radioactivity frequently.  Once the specimen was removed it was oriented with the appropriate paint colors.  A specimen radiograph was  obtained that showed the clip and seed to be within the specimen.  The specimen was then sent to pathology for further evaluation.  The dissection was carried all the way to the chest wall muscle.  Hemostasis was achieved using the Bovie electrocautery.  The wound was irrigated with saline and infiltrated with more quarter percent Marcaine.  The deep layer of the wound was then closed with layers of interrupted 3-0 Vicryl stitches.  The skin was then closed with interrupted 4-0 Monocryl subcuticular stitches.  Dermabond dressings were applied.  The patient tolerated the procedure well.  At the end of the case all needle sponge and instrument counts were correct.  The patient was then awakened and taken to recovery in stable condition.  PLAN OF CARE: Discharge to home after PACU  PATIENT DISPOSITION:  PACU - hemodynamically stable.   Delay start of Pharmacological VTE agent (>24hrs) due to surgical blood loss or risk of bleeding: not applicable

## 2020-08-31 NOTE — H&P (Addendum)
Margaret Burns  Location: [facility] Patient #: [id number] DOB: Jun 03, 1966 Married / Language: English / Race: White Female   History of Present Illness  The patient is a 55 year old female who presents with a breast mass. We are asked to see the patient in consultation by Dr. Orland Mustard to evaluate her for atypical duct hyperplasia of the left breast. The patient is a 55 year old white female who recently went for a routine screening mammogram. At that time she was found to have a 5 mm mass in the lower outer quadrant of the left breast. This was biopsied and came back as atypical duct hyperplasia. She does have a family history of breast cancer in 2 aunts. She is otherwise in good health and does not smoke.   Past Surgical History  Breast Biopsy  Left. Colon Polyp Removal - Colonoscopy  Hip Surgery  Right. Hysterectomy (not due to cancer) - Partial   Diagnostic Studies History  Colonoscopy  1-5 years ago Mammogram  within last year Pap Smear  1-5 years ago  Allergies  No Known Drug Allergies  Allergies Reconciled   Medication History  buPROPion HCl ER (XL) (150MG  Tablet ER 24HR, Oral) Active. Probiotic (Oral) Active. Cal-Mag-Zinc-D (Oral) Active. Medications Reconciled  Social History  Alcohol use  Occasional alcohol use. Caffeine use  Coffee. No drug use  Tobacco use  Former smoker.  Family History  Arthritis  Mother. Cerebrovascular Accident  Father. Colon Polyps  Brother. Diabetes Mellitus  Father. Heart Disease  Father. Hypertension  Father, Mother. Ischemic Bowel Disease  Father. Thyroid problems  Father.  Pregnancy / Birth History  Age at menarche  58 years. Age of menopause  <43 Contraceptive History  Oral contraceptives. Gravida  0 Para  0  Other Problems Arthritis  Gastroesophageal Reflux Disease     Review of Systems General Not Present- Appetite Loss, Chills, Fatigue, Fever, Night Sweats, Weight  Gain and Weight Loss. Skin Not Present- Change in Wart/Mole, Dryness, Hives, Jaundice, New Lesions, Non-Healing Wounds, Rash and Ulcer. HEENT Not Present- Earache, Hearing Loss, Hoarseness, Nose Bleed, Oral Ulcers, Ringing in the Ears, Seasonal Allergies, Sinus Pain, Sore Throat, Visual Disturbances, Wears glasses/contact lenses and Yellow Eyes. Respiratory Not Present- Bloody sputum, Chronic Cough, Difficulty Breathing, Snoring and Wheezing. Cardiovascular Not Present- Chest Pain, Difficulty Breathing Lying Down, Leg Cramps, Palpitations, Rapid Heart Rate, Shortness of Breath and Swelling of Extremities. Gastrointestinal Not Present- Abdominal Pain, Bloating, Bloody Stool, Change in Bowel Habits, Chronic diarrhea, Constipation, Difficulty Swallowing, Excessive gas, Gets full quickly at meals, Hemorrhoids, Indigestion, Nausea, Rectal Pain and Vomiting. Female Genitourinary Not Present- Frequency, Nocturia, Painful Urination, Pelvic Pain and Urgency. Musculoskeletal Present- Muscle Pain. Not Present- Back Pain, Joint Pain, Joint Stiffness, Muscle Weakness and Swelling of Extremities. Neurological Not Present- Decreased Memory, Fainting, Headaches, Numbness, Seizures, Tingling, Tremor, Trouble walking and Weakness. Psychiatric Not Present- Anxiety, Bipolar, Change in Sleep Pattern, Depression, Fearful and Frequent crying. Endocrine Not Present- Cold Intolerance, Excessive Hunger, Hair Changes, Heat Intolerance, Hot flashes and New Diabetes. Hematology Not Present- Blood Thinners, Easy Bruising, Excessive bleeding, Gland problems, HIV and Persistent Infections.  Vitals  Weight: 134.25 lb Height: 62in Body Surface Area: 1.61 m Body Mass Index: 24.55 kg/m  Temp.: 98.33F  Pulse: 85 (Regular)  P.OX: 98% (Room air) BP: 102/80(Sitting, Left Arm, Standard)       Physical Exam General Mental Status-Alert. General Appearance-Consistent with stated age. Hydration-Well  hydrated. Voice-Normal.  Head and Neck Head-normocephalic, atraumatic with no lesions or palpable masses.  Trachea-midline. Thyroid Gland Characteristics - normal size and consistency.  Eye Eyeball - Bilateral-Extraocular movements intact. Sclera/Conjunctiva - Bilateral-No scleral icterus.  Chest and Lung Exam Chest and lung exam reveals -quiet, even and easy respiratory effort with no use of accessory muscles and on auscultation, normal breath sounds, no adventitious sounds and normal vocal resonance. Inspection Chest Wall - Normal. Back - normal.  Breast Note: There is no palpable mass in either breast. There is no palpable axillary, supra clavicular, or cervical lymphadenopathy.   Cardiovascular Cardiovascular examination reveals -normal heart sounds, regular rate and rhythm with no murmurs and normal pedal pulses bilaterally.  Abdomen Inspection Inspection of the abdomen reveals - No Hernias. Skin - Scar - no surgical scars. Palpation/Percussion Palpation and Percussion of the abdomen reveal - Soft, Non Tender, No Rebound tenderness, No Rigidity (guarding) and No hepatosplenomegaly. Auscultation Auscultation of the abdomen reveals - Bowel sounds normal.  Neurologic Neurologic evaluation reveals -alert and oriented x 3 with no impairment of recent or remote memory. Mental Status-Normal.  Musculoskeletal Normal Exam - Left-Upper Extremity Strength Normal and Lower Extremity Strength Normal. Normal Exam - Right-Upper Extremity Strength Normal and Lower Extremity Strength Normal.  Lymphatic Head & Neck  General Head & Neck Lymphatics: Bilateral - Description - Normal. Axillary  General Axillary Region: Bilateral - Description - Normal. Tenderness - Non Tender. Femoral & Inguinal  Generalized Femoral & Inguinal Lymphatics: Bilateral - Description - Normal. Tenderness - Non Tender.    Assessment & Plan  ATYPICAL DUCTAL HYPERPLASIA OF LEFT  BREAST (N60.92) Impression: The patient appears to have a 5 mm area of atypical duct hyperplasia in the lower outer quadrant of the left breast. Because this is considered a high risk lesion with a lifetime risk of breast cancer around 30% and because the appearance of atypical ductal hyperplasia can be similar to pre-invasive cancer I would recommend that this area be removed. She would also like to have this done. I have discussed with her in detail the risks and benefits of the operation as well as some of the technical aspects including the use of a radioactive seed for localization and she understands and wishes to proceed. I will also refer her to the high risk clinic at the cancer center to talk about risk reduction. This patient encounter took 45 minutes today to perform the following: take history, perform exam, review outside records, interpret imaging, counsel the patient on their diagnosis and document encounter, findings & plan in the EHR Current Plans Referred to Oncology, for evaluation and follow up (Oncology). Routine.

## 2020-08-31 NOTE — Anesthesia Procedure Notes (Signed)
Procedure Name: LMA Insertion Date/Time: 08/31/2020 2:11 PM Performed by: Lavonia Dana, CRNA Pre-anesthesia Checklist: Patient identified, Emergency Drugs available, Suction available and Patient being monitored Patient Re-evaluated:Patient Re-evaluated prior to induction Oxygen Delivery Method: Circle system utilized Preoxygenation: Pre-oxygenation with 100% oxygen Induction Type: IV induction Ventilation: Mask ventilation without difficulty LMA: LMA inserted LMA Size: 4.0 Number of attempts: 1 Airway Equipment and Method: Bite block Placement Confirmation: positive ETCO2 Tube secured with: Tape Dental Injury: Teeth and Oropharynx as per pre-operative assessment

## 2020-08-31 NOTE — Anesthesia Postprocedure Evaluation (Signed)
Anesthesia Post Note  Patient: NIHAL DOAN  Procedure(s) Performed: RADIOACTIVE SEED GUIDED LEFT BREAST LUMPECTOMY (Left Breast)     Patient location during evaluation: PACU Anesthesia Type: General Level of consciousness: awake and alert Pain management: pain level controlled Vital Signs Assessment: post-procedure vital signs reviewed and stable Respiratory status: spontaneous breathing, nonlabored ventilation and respiratory function stable Cardiovascular status: blood pressure returned to baseline and stable Postop Assessment: no apparent nausea or vomiting Anesthetic complications: no   No complications documented.  Last Vitals:  Vitals:   08/31/20 1515 08/31/20 1545  BP: 111/77 107/71  Pulse: 77 80  Resp: 18 16  Temp:  36.5 C  SpO2: 100% 99%    Last Pain:  Vitals:   08/31/20 1545  TempSrc:   PainSc: [place]

## 2020-08-31 NOTE — Transfer of Care (Signed)
Immediate Anesthesia Transfer of Care Note  Patient: Margaret Burns  Procedure(s) Performed: RADIOACTIVE SEED GUIDED LEFT BREAST LUMPECTOMY (Left Breast)  Patient Location: PACU  Anesthesia Type:General  Level of Consciousness: sedated  Airway & Oxygen Therapy: Patient Spontanous Breathing and Patient connected to face mask oxygen  Post-op Assessment: Report given to RN and Post -op Vital signs reviewed and stable  Post vital signs: Reviewed and stable  Last Vitals:  Vitals Value Taken Time  BP    Temp    Pulse    Resp    SpO2      Last Pain:  Vitals:   08/31/20 1211  TempSrc: Oral  PainSc: 0-No pain      Patients Stated Pain Goal: 1 (48/59/27 [id number])  Complications: No complications documented.

## 2020-08-31 NOTE — Interval H&P Note (Signed)
History and Physical Interval Note:  08/31/2020 1:38 PM  Margaret Burns  has presented today for surgery, with the diagnosis of LEFT BREAST ATYPICAL DUCTAL HYPERPLASIA.  The various methods of treatment have been discussed with the patient and family. After consideration of risks, benefits and other options for treatment, the patient has consented to  Procedure(s): RADIOACTIVE SEED GUIDED LEFT BREAST LUMPECTOMY (Left) as a surgical intervention.  The patient's history has been reviewed, patient examined, no change in status, stable for surgery.  I have reviewed the patient's chart and labs.  Questions were answered to the patient's satisfaction.     [name]

## 2020-08-31 NOTE — Anesthesia Preprocedure Evaluation (Signed)
Anesthesia Evaluation  Patient identified by MRN, date of birth, ID band Patient awake    Reviewed: Allergy & Precautions, H&P , NPO status , Patient's Chart, lab work & pertinent test results  Airway Mallampati: II  TM Distance: >3 FB Neck ROM: Full    Dental no notable dental hx.    Pulmonary neg pulmonary ROS,    Pulmonary exam normal breath sounds clear to auscultation       Cardiovascular negative cardio ROS Normal cardiovascular exam Rhythm:Regular Rate:Normal     Neuro/Psych Depression negative neurological ROS  negative psych ROS   GI/Hepatic negative GI ROS, Neg liver ROS,   Endo/Other  negative endocrine ROS  Renal/GU negative Renal ROS  negative genitourinary   Musculoskeletal  (+) Arthritis , Osteoarthritis,    Abdominal   Peds negative pediatric ROS (+)  Hematology negative hematology ROS (+)   Anesthesia Other Findings   Reproductive/Obstetrics negative OB ROS                             Anesthesia Physical Anesthesia Plan  ASA: II  Anesthesia Plan: General   Post-op Pain Management:    Induction: Intravenous  PONV Risk Score and Plan: 3 and Ondansetron, Dexamethasone, Midazolam and Treatment may vary due to age or medical condition  Airway Management Planned: LMA  Additional Equipment:   Intra-op Plan:   Post-operative Plan: Extubation in OR  Informed Consent: I have reviewed the patients History and Physical, chart, labs and discussed the procedure including the risks, benefits and alternatives for the proposed anesthesia with the patient or authorized representative who has indicated his/her understanding and acceptance.     Dental advisory given  Plan Discussed with: CRNA  Anesthesia Plan Comments:         Anesthesia Quick Evaluation

## 2020-09-03 NOTE — Addendum Note (Signed)
Addendum  created 09/03/20 0907 by Karysa Heft, Ernesta Amble, CRNA   Charge Capture section accepted

## 2020-09-04 ENCOUNTER — Telehealth: Payer: Self-pay | Admitting: Hematology and Oncology

## 2020-09-04 ENCOUNTER — Encounter (HOSPITAL_BASED_OUTPATIENT_CLINIC_OR_DEPARTMENT_OTHER): Payer: Self-pay | Admitting: General Surgery

## 2020-09-04 NOTE — Telephone Encounter (Signed)
Received a new pt referral from Dr. Chryl Heck for atypical ductal hyperplasia. Pt has been cld and scheduled to see Dr. Chryl Heck on 3/8 at 1120am. Pt aware to arrive 20 minutes early.

## 2020-09-05 LAB — SURGICAL PATHOLOGY

## 2020-09-25 ENCOUNTER — Inpatient Hospital Stay: Payer: 59 | Attending: Hematology and Oncology | Admitting: Hematology and Oncology

## 2020-09-25 ENCOUNTER — Other Ambulatory Visit: Payer: Self-pay

## 2020-09-25 ENCOUNTER — Encounter: Payer: Self-pay | Admitting: *Deleted

## 2020-09-25 ENCOUNTER — Telehealth: Payer: Self-pay | Admitting: Hematology and Oncology

## 2020-09-25 ENCOUNTER — Encounter: Payer: Self-pay | Admitting: Hematology and Oncology

## 2020-09-25 VITALS — BP 114/71 | HR 78 | Temp 97.8°F | Resp 16 | Ht 62.0 in | Wt 136.4 lb

## 2020-09-25 DIAGNOSIS — Z800 Family history of malignant neoplasm of digestive organs: Secondary | ICD-10-CM

## 2020-09-25 DIAGNOSIS — Z96641 Presence of right artificial hip joint: Secondary | ICD-10-CM | POA: Diagnosis not present

## 2020-09-25 DIAGNOSIS — Z803 Family history of malignant neoplasm of breast: Secondary | ICD-10-CM | POA: Diagnosis not present

## 2020-09-25 DIAGNOSIS — Z90710 Acquired absence of both cervix and uterus: Secondary | ICD-10-CM

## 2020-09-25 DIAGNOSIS — Z780 Asymptomatic menopausal state: Secondary | ICD-10-CM

## 2020-09-25 DIAGNOSIS — N6092 Unspecified benign mammary dysplasia of left breast: Secondary | ICD-10-CM

## 2020-09-25 DIAGNOSIS — Z9189 Other specified personal risk factors, not elsewhere classified: Secondary | ICD-10-CM

## 2020-09-25 NOTE — Progress Notes (Signed)
This is [facility] NOTE  Patient Care Team: London Pepper, MD as PCP - General (Family Medicine)  CHIEF COMPLAINTS/PURPOSE OF CONSULTATION:   Atypical ductal hyperplasia, high risk of breast cancer  ASSESSMENT & PLAN:  No problem-specific Assessment & Plan notes found for this encounter.  No orders of the defined types were placed in this encounter.  1.  Atypical ductal hyperplasia status post lumpectomy.  We have discussed about atypical ductal hyperplasia which confers an increased risk of breast cancer by relative risk of 3-5. In patients with atypical ductal hyperplasia, chemoprevention with tamoxifen has been studied and it shows reduce risk of breast cancer in both breasts including reduced risk of distant metastatic disease. We have discussed low-dose tamoxifen that has been studied based on TAM 01 trial versus regular dose tamoxifen of 20 mg daily.  Discussed mechanism of action of tamoxifen, adverse effects of tamoxifen including but not limited to postmenopausal symptoms, increased risk of DVT/PE, increased risk of endometrial cancer which does not apply to this patient since she had hysterectomy, questionable increased risk of cardiovascular events.  Her benefit with tamoxifen would be improved bone density.  She would like to think about this for couple weeks and we have scheduled a telephone visit to follow-up on these recommendations.  I do think she will benefit from tamoxifen prevention in this instance. We also calculated her lifetime risk of breast cancer which exceeds 20% and she may qualify for alternating MRIs with mammograms.  We have discussed the unknown long-term risks of gadolinium deposition MRIs and increased false positives and increased need for biopsies.  Again she would like to think about this as well.  She most recently had an MRI.  I encouraged continuing healthy lifestyle including diet modifications, considering more plant-based diet,  exercise and to minimize alcohol intake.  I encouraged self breast exam and return to clinic in 2 weeks for telephone visit and further follow-up based on her decision about taking tamoxifen. Otherwise, age-appropriate cancer screening, she says she is up-to-date. She had genetic testing outside [facility] facility, she was asked to bring [place] a copy of this report, verbally she says it was negative.  Thank you for consulting [place] in the care of this patient.  Please not hesitate to contact us with any additional questions or concerns.  HISTORY OF PRESENTING ILLNESS:   Margaret Burns 55 y.o. female is here because of atypical ductal hyperplasia.  This is a very pleasant 55 year old female patient with abnormality detected on a routine screening mammogram who had left breast lumpectomy by Dr. Marlou Starks on 08/31/2020 referred to high risk breast cancer clinic for additional recommendations for breast cancer prevention.  Ms. Lexine had a screening mammogram which showed a 0.4 cm irregular focal symmetry in the left breast lower outer aspect posterior depth 5 cm from the nipple.  She had stereotactic guided biopsy which showed atypical ductal hyperplasia and hence was recommended to see the breast surgeon.  She had a lumpectomy given this finding and final surgical pathology did not show any evidence of atypical ductal hyperplasia.  Final pathology showed focal flat epithelial atypia, columnar cell changes and microglandular adenosis with calcifications. No malignancy identified.  She denies any prior breast biopsies.  She does have some family history In her maternal aunts, both of them had breast cancer in late her 73s or 6s and did not die from breast cancer.  She has also taking some estrogen supplementation for several years, at least  6 years or more and has recently quit.  She had hysterectomy for endometriosis many years ago, has both ovaries.  Menopause possibly in mid 29s.  She is nulliparous, no  pregnancies or miscarriages.  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  Rest of the pertinent 10 point ROS reviewed and negative as mentioned below.  REVIEW OF SYSTEMS:   Constitutional: Denies fevers, chills or abnormal night sweats Eyes: Denies blurriness of vision, double vision or watery eyes Ears, nose, mouth, throat, and face: Denies mucositis or sore throat Respiratory: Denies cough, dyspnea or wheezes Cardiovascular: Denies palpitation, chest discomfort or lower extremity swelling Gastrointestinal:  Denies nausea, heartburn or change in bowel habits Skin: Denies abnormal skin rashes Lymphatics: Denies new lymphadenopathy or easy bruising Neurological:Denies numbness, tingling or new weaknesses Behavioral/Psych: Mood is stable, no new changes  All other systems were reviewed with the patient and are negative.  MEDICAL HISTORY:  Past Medical History:  Diagnosis Date  . Arthritis   . Depression     SURGICAL HISTORY: Past Surgical History:  Procedure Laterality Date  . ABDOMINAL HYSTERECTOMY    . BREAST LUMPECTOMY WITH RADIOACTIVE SEED LOCALIZATION Left 08/31/2020   Procedure: RADIOACTIVE SEED GUIDED LEFT BREAST LUMPECTOMY;  Surgeon: Jovita Kussmaul, MD;  Location: [facility];  Service: General;  Laterality: Left;  . JOINT REPLACEMENT     Hip Right    SOCIAL HISTORY: Social History   Socioeconomic History  . Marital status: Married    Spouse name: Not on file  . Number of children: Not on file  . Years of education: Not on file  . Highest education level: Not on file  Occupational History  . Not on file  Tobacco Use  . Smoking status: Never Smoker  . Smokeless tobacco: Never Used  Vaping Use  . Vaping Use: Never used  Substance and Sexual Activity  . Alcohol use: Yes    Comment: occ  . Drug use: Never  . Sexual activity: Not on file  Other Topics Concern  . Not on file  Social History Narrative  .  Not on file   Social Determinants of Health   Financial Resource Strain: Not on file  Food Insecurity: Not on file  Transportation Needs: Not on file  Physical Activity: Not on file  Stress: Not on file  Social Connections: Not on file  Intimate Partner Violence: Not on file    FAMILY HISTORY: Family History  Problem Relation Age of Onset  . Hypertension Mother   . Diabetes Father   . Heart disease Father   . Breast cancer Maternal Aunt   . Pancreatic cancer Maternal Uncle   . Breast cancer Maternal Aunt     ALLERGIES:  is allergic to tape.  MEDICATIONS:  Current Outpatient Medications  Medication Sig Dispense Refill  . acetaminophen (TYLENOL) 500 MG tablet Take 500 mg by mouth every 6 (six) hours as needed.    Marland Kitchen buPROPion (WELLBUTRIN XL) 150 MG 24 hr tablet bupropion HCl XL 150 mg 24 hr tablet, extended release  TAKE 1 TABLET DAILY    . cholecalciferol (VITAMIN D3) 25 MCG (1000 UNIT) tablet Take 1,000 Units by mouth daily.    . diphenhydrAMINE (BENADRYL) 25 MG tablet Take 25 mg by mouth every 6 (six) hours as needed.    Marland Kitchen HYDROcodone-acetaminophen (NORCO/VICODIN) 5-325 MG tablet Take 1-2 tablets by mouth every 6 (six) hours as needed for moderate pain or severe pain. (Patient not  taking: Reported on 09/25/2020) 10 tablet 0  . Multiple Minerals-Vitamins (CAL MAG ZINC +D3) TABS 1 tablet    . triamcinolone (KENALOG) 0.1 % Apply topically.     No current facility-administered medications for this visit.     PHYSICAL EXAMINATION:  ECOG PERFORMANCE STATUS: 0 - Asymptomatic  Vitals:   09/25/20 1125  BP: 114/71  Pulse: 78  Resp: 16  Temp: 97.8 F (36.6 C)  SpO2: 100%   Filed Weights   09/25/20 1125  Weight: 136 lb 6.4 oz (61.9 kg)    GENERAL:alert, no distress and comfortable SKIN: skin color, texture, turgor are normal, no rashes or significant lesions EYES: normal, conjunctiva are pink and non-injected, sclera clear OROPHARYNX:no exudate, no erythema and lips,  buccal mucosa, and tongue normal  NECK: supple, thyroid normal size, non-tender, without nodularity LYMPH:  no palpable lymphadenopathy in the cervical, axillary or inguinal LUNGS: clear to auscultation and percussion with normal breathing effort HEART: regular rate & rhythm and no murmurs and no lower extremity edema ABDOMEN:abdomen soft, non-tender and normal bowel sounds Musculoskeletal:no cyanosis of digits and no clubbing  PSYCH: alert & oriented x 3 with fluent speech NEURO: no focal motor/sensory deficits Breast: Bilateral breasts examined.  Left breast has surgical scar which appears to have healed well.  No palpable breast changes.  No regional adenopathy identified  LABORATORY DATA:  I have reviewed the data as listed Lab Results  Component Value Date   WBC 8.7 10/17/2017   HGB 12.5 10/17/2017   HCT 37.2 10/17/2017   MCV 92.5 10/17/2017   PLT 393 10/17/2017     Chemistry      Component Value Date/Time   NA 139 10/17/2017 1214   K 4.3 10/17/2017 1214   CL 106 10/17/2017 1214   CO2 25 10/17/2017 1214   BUN 11 10/17/2017 1214   CREATININE 0.60 10/17/2017 1214      Component Value Date/Time   CALCIUM 9.3 10/17/2017 1214   ALKPHOS 54 10/17/2017 1214   AST 21 10/17/2017 1214   ALT 15 10/17/2017 1214   BILITOT 0.7 10/17/2017 1214       RADIOGRAPHIC STUDIES: I have personally reviewed the radiological images as listed and agreed with the findings in the report. No results found.  All questions were answered. The patient knows to call the clinic with any problems, questions or concerns. I spent 45 minutes in the care of this patient including H and P, review of records, counseling and coordination of care.     Benay Pike, MD 09/25/2020 11:43 AM

## 2020-09-25 NOTE — Telephone Encounter (Signed)
Scheduled per los. Declined printout  

## 2020-09-25 NOTE — Progress Notes (Signed)
Margaret Burns Psychosocial Distress Screening Clinical Social Work  Clinical Social Work was referred by distress screening protocol.  The patient scored a 7 on the Psychosocial Distress Thermometer which indicates moderate distress. Clinical Social Worker contacted patient by phone to assess for distress and other psychosocial needs.  Patient stated she recently had a lumpectomy; "which was not cancer".  Patient was seen in the high risk clinic at [facility].  Patient stated she felt much better after meeting with the physician and getting information on preventative treatment options.  Patient did not identify any additional needs or concerns at this time.  CSW provided brief education on the support team and support services at [facility].  Patient was appreciative of CSW contact.  CSW encouraged patient to call with questions or concerns.      ONCBCN DISTRESS SCREENING 09/25/2020  Screening Type Initial Screening  Distress experienced in past week (1-10) 7  Family Problem type Other (comment)    Margaret Burns, MSW, LCSW, OSW-C Clinical Social Worker [place] [phone]

## 2020-09-26 ENCOUNTER — Encounter: Payer: Self-pay | Admitting: Hematology and Oncology

## 2020-10-17 ENCOUNTER — Encounter: Payer: Self-pay | Admitting: Hematology and Oncology

## 2020-10-17 ENCOUNTER — Inpatient Hospital Stay (HOSPITAL_BASED_OUTPATIENT_CLINIC_OR_DEPARTMENT_OTHER): Payer: 59 | Admitting: Hematology and Oncology

## 2020-10-17 ENCOUNTER — Telehealth: Payer: Self-pay | Admitting: Hematology and Oncology

## 2020-10-17 DIAGNOSIS — N6092 Unspecified benign mammary dysplasia of left breast: Secondary | ICD-10-CM

## 2020-10-17 NOTE — Progress Notes (Signed)
This is [facility] CONSULT NOTE  Patient Care Team: London Pepper, MD as PCP - General (Family Medicine)  CHIEF COMPLAINTS/PURPOSE OF CONSULTATION:   Atypical ductal hyperplasia, high risk of breast cancer  ASSESSMENT & PLAN:   Atypical ductal hyperplasia of left breast  Atypical ductal hyperplasia status post lumpectomy.  We have discussed about atypical ductal hyperplasia which confers an increased risk of breast cancer by relative risk of 3-5. In patients with atypical ductal hyperplasia, chemoprevention with tamoxifen has been studied and it shows  During our last visit, I discussed about considering tamoxifen, discussed mechanism of action, adverse effects, and benefits. We have discussed about MRI and alternating mammogram for breast cancer surveillance.  She wanted to think about it and RTC in 2 weeks for further discussion. She is here for telephone visit. She wants to wait on tamoxifen at this time. She understands the benefits and risk She would like to proceed with MRI and Mammogram. She understands long term risk of gadolinium is unknown at this time. She know MRI are very sensitive and may result in more biopsies I have encouraged her to FU with [place] in 6 months,  SBE, diet and exercise encouraged.   No orders of the defined types were placed in this encounter.   HISTORY OF PRESENTING ILLNESS:   Margaret Burns 55 y.o. female is here because of atypical ductal hyperplasia.  This is a very pleasant 55 year old female patient with abnormality detected on a routine screening mammogram who had left breast lumpectomy by Dr. Marlou Starks on 08/31/2020 referred to high risk breast cancer clinic for additional recommendations for breast cancer prevention.  Ms. Jakelin had a screening mammogram which showed a 0.4 cm irregular focal symmetry in the left breast lower outer aspect posterior depth 5 cm from the nipple.  She had stereotactic guided biopsy which showed atypical  ductal hyperplasia and hence was recommended to see the breast surgeon.  She had a lumpectomy given this finding and final surgical pathology did not show any evidence of atypical ductal hyperplasia.  Final pathology showed focal flat epithelial atypia, columnar cell changes and microglandular adenosis with calcifications. No malignancy identified.  She denies any prior breast biopsies.  She does have some family history In her maternal aunts, both of them had breast cancer in late her 89s or 48s and did not die from breast cancer.  She has also taking some estrogen supplementation for several years, at least 6 years or more and has recently quit.  She had hysterectomy for endometriosis many years ago, has both ovaries.  Menopause possibly in mid 76s.  She is nulliparous, no pregnancies or miscarriages.  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   Interval History  She is here for a telephone FU She would like to wait on tamoxifen at this time. She would like to focus on diet and life style changes. She has stopped the HRT and feels like this may have contributed to the pathology. No other changes since last visit. Rest of the pertinent ROS unremarkable  REVIEW OF SYSTEMS:   Constitutional: Denies fevers, chills or abnormal night sweats Eyes: Denies blurriness of vision, double vision or watery eyes Ears, nose, mouth, throat, and face: Denies mucositis or sore throat Respiratory: Denies cough, dyspnea or wheezes Cardiovascular: Denies palpitation, chest discomfort or lower extremity swelling Gastrointestinal:  Denies nausea, heartburn or change in bowel habits Skin: Denies abnormal skin rashes Lymphatics: Denies new lymphadenopathy or  easy bruising Neurological:Denies numbness, tingling or new weaknesses Behavioral/Psych: Mood is stable, no new changes  All other systems were reviewed with the patient and are negative.  MEDICAL HISTORY:  Past  Medical History:  Diagnosis Date  . Arthritis   . Depression     SURGICAL HISTORY: Past Surgical History:  Procedure Laterality Date  . ABDOMINAL HYSTERECTOMY    . BREAST LUMPECTOMY WITH RADIOACTIVE SEED LOCALIZATION Left 08/31/2020   Procedure: RADIOACTIVE SEED GUIDED LEFT BREAST LUMPECTOMY;  Surgeon: Jovita Kussmaul, MD;  Location: [place];  Service: General;  Laterality: Left;  . JOINT REPLACEMENT     Hip Right    SOCIAL HISTORY: Social History   Socioeconomic History  . Marital status: Married    Spouse name: Not on file  . Number of children: Not on file  . Years of education: Not on file  . Highest education level: Not on file  Occupational History  . Not on file  Tobacco Use  . Smoking status: Never Smoker  . Smokeless tobacco: Never Used  Vaping Use  . Vaping Use: Never used  Substance and Sexual Activity  . Alcohol use: Yes    Comment: occ  . Drug use: Never  . Sexual activity: Not on file  Other Topics Concern  . Not on file  Social History Narrative  . Not on file   Social Determinants of Health   Financial Resource Strain: Not on file  Food Insecurity: Not on file  Transportation Needs: Not on file  Physical Activity: Not on file  Stress: Not on file  Social Connections: Not on file  Intimate Partner Violence: Not on file    FAMILY HISTORY: Family History  Problem Relation Age of Onset  . Hypertension Mother   . Diabetes Father   . Heart disease Father   . Breast cancer Maternal Aunt   . Pancreatic cancer Maternal Uncle   . Breast cancer Maternal Aunt     ALLERGIES:  is allergic to tape.  MEDICATIONS:  Current Outpatient Medications  Medication Sig Dispense Refill  . acetaminophen (TYLENOL) 500 MG tablet Take 500 mg by mouth every 6 (six) hours as needed.    Marland Kitchen buPROPion (WELLBUTRIN XL) 150 MG 24 hr tablet bupropion HCl XL 150 mg 24 hr tablet, extended release  TAKE 1 TABLET DAILY    . cholecalciferol (VITAMIN D3) 25  MCG (1000 UNIT) tablet Take 1,000 Units by mouth daily.    . diphenhydrAMINE (BENADRYL) 25 MG tablet Take 25 mg by mouth every 6 (six) hours as needed.    Marland Kitchen HYDROcodone-acetaminophen (NORCO/VICODIN) 5-325 MG tablet Take 1-2 tablets by mouth every 6 (six) hours as needed for moderate pain or severe pain. (Patient not taking: Reported on 09/25/2020) 10 tablet 0  . Multiple Minerals-Vitamins (CAL MAG ZINC +D3) TABS 1 tablet    . triamcinolone (KENALOG) 0.1 % Apply topically.     No current facility-administered medications for this visit.     PHYSICAL EXAMINATION:  ECOG PERFORMANCE STATUS: 0 - Asymptomatic VS and PE not done, telephone visit. LABORATORY DATA:  I have reviewed the data as listed Lab Results  Component Value Date   WBC 8.7 10/17/2017   HGB 12.5 10/17/2017   HCT 37.2 10/17/2017   MCV 92.5 10/17/2017   PLT 393 10/17/2017     Chemistry      Component Value Date/Time   NA 139 10/17/2017 1214   K 4.3 10/17/2017 1214   CL 106 10/17/2017 1214  CO2 25 10/17/2017 1214   BUN 11 10/17/2017 1214   CREATININE 0.60 10/17/2017 1214      Component Value Date/Time   CALCIUM 9.3 10/17/2017 1214   ALKPHOS 54 10/17/2017 1214   AST 21 10/17/2017 1214   ALT 15 10/17/2017 1214   BILITOT 0.7 10/17/2017 1214       RADIOGRAPHIC STUDIES: I have personally reviewed the radiological images as listed and agreed with the findings in the report. No results found.  All questions were answered. The patient knows to call the clinic with any problems, questions or concerns. I spent 15 minutes in the care of this patient including H and P, review of records, counseling and coordination of care.     Benay Pike, MD 10/17/2020 9:13 AM

## 2020-10-17 NOTE — Telephone Encounter (Signed)
Scheduled appt per 3/30 sch msg. Pt aware.  

## 2020-10-17 NOTE — Assessment & Plan Note (Addendum)
Atypical ductal hyperplasia status post lumpectomy.  We have discussed about atypical ductal hyperplasia which confers an increased risk of breast cancer by relative risk of 3-5. In patients with atypical ductal hyperplasia, chemoprevention with tamoxifen has been studied and it shows  During our last visit, I discussed about considering tamoxifen, discussed mechanism of action, adverse effects, and benefits. We have discussed about MRI and alternating mammogram for breast cancer surveillance.  She wanted to think about it and RTC in 2 weeks for further discussion. She is here for telephone visit. She wants to wait on tamoxifen at this time. She understands the benefits and risk She would like to proceed with MRI and Mammogram. She understands long term risk of gadolinium is unknown at this time. She know MRI are very sensitive and may result in more biopsies I have encouraged her to FU with [place] in 6 months,  SBE, diet and exercise encouraged.

## 2021-04-30 ENCOUNTER — Inpatient Hospital Stay: Payer: 59 | Attending: Hematology and Oncology | Admitting: Hematology and Oncology

## 2021-11-26 ENCOUNTER — Encounter (HOSPITAL_COMMUNITY): Payer: Self-pay

## 2023-01-28 ENCOUNTER — Encounter (INDEPENDENT_AMBULATORY_CARE_PROVIDER_SITE_OTHER): Payer: Self-pay | Admitting: Otolaryngology

## 2023-01-28 ENCOUNTER — Ambulatory Visit (INDEPENDENT_AMBULATORY_CARE_PROVIDER_SITE_OTHER): Payer: 59 | Admitting: Otolaryngology

## 2023-01-28 ENCOUNTER — Institutional Professional Consult (permissible substitution) (INDEPENDENT_AMBULATORY_CARE_PROVIDER_SITE_OTHER): Payer: 59 | Admitting: Otolaryngology

## 2023-01-28 VITALS — BP 107/73 | HR 75 | Ht 62.0 in | Wt 128.0 lb

## 2023-01-28 DIAGNOSIS — R0981 Nasal congestion: Secondary | ICD-10-CM | POA: Diagnosis not present

## 2023-01-28 DIAGNOSIS — H6993 Unspecified Eustachian tube disorder, bilateral: Secondary | ICD-10-CM | POA: Diagnosis not present

## 2023-01-28 DIAGNOSIS — M542 Cervicalgia: Secondary | ICD-10-CM

## 2023-01-28 DIAGNOSIS — G43009 Migraine without aura, not intractable, without status migrainosus: Secondary | ICD-10-CM

## 2023-01-28 DIAGNOSIS — K1120 Sialoadenitis, unspecified: Secondary | ICD-10-CM | POA: Diagnosis not present

## 2023-01-28 DIAGNOSIS — J3089 Other allergic rhinitis: Secondary | ICD-10-CM

## 2023-01-28 DIAGNOSIS — H6121 Impacted cerumen, right ear: Secondary | ICD-10-CM

## 2023-01-28 DIAGNOSIS — M26623 Arthralgia of bilateral temporomandibular joint: Secondary | ICD-10-CM

## 2023-01-28 DIAGNOSIS — H9193 Unspecified hearing loss, bilateral: Secondary | ICD-10-CM

## 2023-01-28 NOTE — Progress Notes (Signed)
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 She took Meloxicam, did ice and Aleve. She had pain with wide mouth opening. Once before years ago she had that happen to her. She feels that she also had puffiness along the right parotid region. She never felt a lump at the right parotid gland, no paresthesia and no facial muscle weakness.  All of these sx improved at this time after over-the-counter anti-inflammatory medications.    She also reports episodes of nausea/vertigo/off balance sensation lasting for hours. She takes Sudafed OTC and Nasocort and it seems to help, but only for a few days. She has environmental  allergies, on Zyrtec, Nasocort daily. She denies headaches or migraine history. She reports good sense of smell. No recurrent sinus infections. She reports hard time hearing in the noisy environment. 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  No prior hearing tests.  No history of skin cancer or other cancers in the past, no history of radiation, or radioactive iodine, no prior episodes of salivary gland disease.  No prior imaging.  She points at the area of the right SCM cervical spine attachment to the mastoid tip when asked where the swelling and discomfort are located.     Records Reviewed:  Had MRI breast for 5 mm left lower breast     Past Medical History:  Diagnosis Date   Arthritis    Depression     Past Surgical History:  Procedure Laterality Date   ABDOMINAL HYSTERECTOMY     BREAST LUMPECTOMY WITH RADIOACTIVE SEED LOCALIZATION Left 08/31/2020   Procedure: RADIOACTIVE SEED GUIDED LEFT BREAST  LUMPECTOMY;  Surgeon: Griselda Miner, MD;  Location: [facility];  Service: General;  Laterality: Left;   JOINT REPLACEMENT     Hip Right    Family History  Problem Relation Age of Onset   Hypertension Mother    Diabetes Father    Heart disease Father    Breast cancer Maternal Aunt    Pancreatic cancer Maternal Uncle    Breast cancer Maternal Aunt     Social History:  reports that she has never smoked. She has never used smokeless tobacco. She reports current alcohol use. She reports that she does not use drugs.  Allergies:  Allergies  Allergen Reactions   Tape     Rash/blisters    Medications: I have reviewed the patient's current medications.  No results found for this or any previous visit (from the past 48 hour(s)).  No results found.  The PMH, PSH, Medications, Allergies, and SH were reviewed and updated.  ROS: Constitutional: Negative for fever, weight loss and weight gain. Cardiovascular: Negative for chest pain and dyspnea on exertion. Respiratory: Is not experiencing shortness of breath at rest. Gastrointestinal: Negative for nausea and vomiting. Neurological: Negative for headaches. Psychiatric: The patient is not nervous/anxious  See HPI for pertinent (+)   Blood pressure 107/73, pulse 75, height 5\' 2"  (1.575 m), weight 128 lb (58.1 kg), SpO2 97 %.  PHYSICAL EXAM:  Exam: General: Well-developed, well-nourished Communication and Voice: Clear pitch and clarity Respiratory Respiratory effort: Equal inspiration and  expiration without stridor Cardiovascular Peripheral Vascular: Warm extremities with equal color/perfusion Eyes: No nystagmus with equal extraocular motion bilaterally Neuro/Psych/Balance: Patient oriented to person, place, and time; Appropriate mood and affect; Gait is intact with no imbalance; Cranial nerves I-XII are intact Head and Face Inspection: Normocephalic and atraumatic without mass or lesion Palpation: Facial skeleton  intact without bony stepoffs Salivary Glands: No mass or tenderness Facial Strength: Facial motility symmetric and full bilaterally ENT Pinna: External ear intact and fully developed External canal: Canal is patent with intact skin Tympanic Membrane: Clear and mobile External Nose: No scar or anatomic deformity Internal Nose: Septum intact and midline. No edema, polyp, or rhinorrhea Lips, Teeth, and gums: Mucosa and teeth intact and viable TMJ: No pain to palpation with full mobility Oral cavity/oropharynx: No erythema or exudate, no lesions present Neck Neck and Trachea: Midline trachea without mass or lesion Thyroid: No mass or nodularity Lymphatics: No lymphadenopathy  Procedure:  Procedure: Cerumen Removal, right  Diagnosis: cerumen impaction   Informed consent: Timeout performed and informed consent was obtained.  Procedure: Operating microscope was employed to evaluate the ear(s).  Cerumen curette, speculum and suction were employed to clear the cerumen.   Findings: Normal appearing tympanic membrane on the left without perforations, and external canals are normal after removal of cerumen.No middle ear fluid bilaterally.   Complications: None. Patient tolerated well.   Studies Reviewed:MRI breast 07/2020 LINICAL DATA:  57 year old female who was recalled from a recent screening mammogram for a 5 mm mass in the lower outer left breast. Biopsy indicated atypical ductal hyperplasia. The lesion has not yet been excised. She has family history of breast cancer in 2 aunts.   LABS:  None.   EXAM: BILATERAL BREAST MRI WITH AND WITHOUT CONTRAST   TECHNIQUE: Multiplanar, multisequence MR images of both breasts were obtained prior to and following the intravenous administration of 6 ml of Gadavist   Three-dimensional MR images were rendered by post-processing of the original MR data on an independent workstation. The three-dimensional MR images were interpreted, and findings  are reported in the following complete MRI report for this study. Three dimensional images were evaluated at the independent interpreting workstation using the DynaCAD thin client.   COMPARISON:  No prior MRI available for comparison. Correlation made with prior mammogram and ultrasound images.   FINDINGS: Breast composition: b. Scattered fibroglandular tissue.   Background parenchymal enhancement: Minimal   Right breast: No mass or abnormal enhancement.   Left breast: The biopsy marking clip from the recent biopsy in the lower outer posterior left breast can be seen on the T1 weighted images. No suspicious associated enhancement is noted near the site of biopsy. No mass or abnormal enhancement is seen in the left breast.   Lymph nodes: No abnormal appearing lymph nodes.   Ancillary findings:  None.   IMPRESSION: 1. No suspicious enhancement is identified at the biopsied site of atypical ductal hyperplasia in the lower outer left breast.   2.  No MRI findings of malignancy in either breast.   RECOMMENDATION: Continue treatment plan for the atypical ductal hyperplasia in the left breast.   BI-RADS CATEGORY  2: Benign.  Assessment/Plan: Encounter Diagnoses  Name Primary?   Cervical pain (neck) Yes   Bilateral hearing loss, unspecified hearing loss type    Sialoadenitis    Nasal congestion    Dysfunction of both eustachian tubes    Environmental and seasonal allergies    Atypical migraine    Impacted cerumen of right  ear    With history of chronic TMJ issues she manages with NSAIDs and compresses as well as a dental guard at night with complaints including along the attachment of her right SCM at the mastoid tip and along the upper cervical spine area that was palpable on exam without any lymphadenopathy or palpable masses.  She reports swelling along the area of salivary glands on the right side which at this time resolved.  She is also endorsing ear fullness and being  unable to pop her ears or hearing underwater.  Has symptoms of seasonal environmental allergies and self treats with over-the-counter antihistamine and nasal steroid spray.  She reports episodes of feeling off balance and nausea that recently started and usually last for hours.  These episodes improved with rest and over-the-counter Sudafed.  I suspect her.  Parotid and submandibular swelling or either related to sialadenitis versus exacerbation of TMJ and muscle pain along the masticator area.  Her ear symptoms are likely related to chronic nasal congestion and eustachian tube dysfunction cannot rule out other causes at this time.  We will start with CT neck with contrast to exclude salivary gland neoplasm, salivary gland stones and see if she has any signs of chronic inflammation in the sinuses or concerning findings along the cervical spine.  Will consider dedicated C-spine MRI in the future.  Will likely consider nasal endoscopy when she returns to evaluate for signs of inflammation in the nasal passages.  Will do screening audiogram due to hearing changes.  She was instructed to continue Zyrtec and nasal steroid spray for now.  Will consider neurology referral for atypical migraine workup and management if symptoms will not resolve after optimizing nasal congestion regimen she is on right now.  I discussed with the patient that sensation of feeling off balance and nausea is sometimes related to atypical migraine which increase in prevalence with age especially in female population.  I do not feel that vestibular testing is necessary at this time as vestibular weakness is very low on my differential. Will consider in the future if necessary.  - schedule Audiogram - continue Zyrtec and nasal steroid spray  - schedule CT neck with contrast - return in 6 weeks    Thank you for allowing me to participate in the care of this patient. Please do not hesitate to contact me with any questions or concerns.    Ashok Croon, MD Otolaryngology [facility] Phone: [phone] Fax: [phone]    01/28/2023, 9:24 AM

## 2023-01-28 NOTE — Patient Instructions (Addendum)
-   schedule Audiogram - continue Zyrtec and nasal steroid spray  - schedule CT neck with contrast - return in 6 weeks

## 2023-01-30 ENCOUNTER — Ambulatory Visit: Payer: 59 | Attending: Otolaryngology | Admitting: Audiologist

## 2023-01-30 DIAGNOSIS — Z0110 Encounter for examination of ears and hearing without abnormal findings: Secondary | ICD-10-CM | POA: Insufficient documentation

## 2023-01-30 DIAGNOSIS — R42 Dizziness and giddiness: Secondary | ICD-10-CM | POA: Insufficient documentation

## 2023-01-30 NOTE — Procedures (Signed)
  [facility] [address] [place], [place]  [address] [phone]  AUDIOLOGICAL  EVALUATION  NAME: MATTINGLY NEIMEYER     DOB:   [birth year]      MRN: [id number]                                                                                     DATE: 01/30/2023     REFERENT: Farris Has, MD STATUS: Outpatient DIAGNOSIS: Dizziness, Normal Hearing    History: Virlie was seen for an audiological evaluation.  Suriyah is receiving a hearing evaluation due to concerns for dizziness and intermittent muffled hearing . Shannan has difficulty hearing on and off, when she is able to pop her ears then the hearing returns. This difficulty gradually. No pain or pressure reported in either ear. No tinnitus. 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  Medical history positive for dizziness which is a risk factor for hearing loss. No other relevant case history reported.   Evaluation:  Otoscopy showed a clear view of the tympanic membranes, bilaterally Tympanometry results were consistent with normal middle ear function, bilaterally   Audiometric testing was completed using conventional audiometry with insert transducer. Speech Recognition Thresholds were 15dB in the right ear and 20 dB in the left ear. Word Recognition was performed 40dB SL, scored 96% in the right ear and 100% in the left ear. Pure tone thresholds show normal hearing bilaterally.   Results:  The test results were reviewed with Rosey Bath. She has normal hearing in each ear. There are no indications of a retrocochlear pathology or middle ear pathology that would impact balance.   Recommendations: 1.   No further audiologic testing is needed unless future hearing concerns arise. Follow up with scheduled ENT appointments.    23 minutes spent testing and counseling on results.   Ammie Ferrier  Audiologist, Au.D., CCC-A 01/30/2023  10:58 AM  Cc:  Farris Has, MD

## 2023-02-13 ENCOUNTER — Ambulatory Visit (HOSPITAL_COMMUNITY)
Admission: RE | Admit: 2023-02-13 | Discharge: 2023-02-13 | Disposition: A | Discharge status: Home / Self Care | Payer: 59 | Source: Ambulatory Visit | Attending: Otolaryngology | Admitting: Otolaryngology

## 2023-02-13 DIAGNOSIS — M542 Cervicalgia: Secondary | ICD-10-CM | POA: Insufficient documentation

## 2023-02-13 MED ORDER — IOHEXOL 350 MG/ML SOLN
75.0000 mL | Freq: Once | INTRAVENOUS | Status: AC | PRN
  Administered 2023-02-13: 75 mL via INTRAVENOUS

## 2023-03-11 ENCOUNTER — Ambulatory Visit (INDEPENDENT_AMBULATORY_CARE_PROVIDER_SITE_OTHER): Payer: 59 | Admitting: Otolaryngology

## 2023-03-11 ENCOUNTER — Encounter (INDEPENDENT_AMBULATORY_CARE_PROVIDER_SITE_OTHER): Payer: Self-pay | Admitting: Otolaryngology

## 2023-03-11 VITALS — BP 103/69 | HR 74

## 2023-03-11 DIAGNOSIS — M542 Cervicalgia: Secondary | ICD-10-CM

## 2023-03-11 DIAGNOSIS — R0981 Nasal congestion: Secondary | ICD-10-CM

## 2023-03-11 DIAGNOSIS — H6993 Unspecified Eustachian tube disorder, bilateral: Secondary | ICD-10-CM | POA: Diagnosis not present

## 2023-03-11 DIAGNOSIS — M26623 Arthralgia of bilateral temporomandibular joint: Secondary | ICD-10-CM

## 2023-03-11 DIAGNOSIS — G43009 Migraine without aura, not intractable, without status migrainosus: Secondary | ICD-10-CM

## 2023-03-11 DIAGNOSIS — J3089 Other allergic rhinitis: Secondary | ICD-10-CM

## 2023-03-11 NOTE — Progress Notes (Signed)
ENT Progress Note:   Update 03/11/23 she returns for follow-up after audiogram and CT neck with contrast.  Both were unremarkable.  She feels that right facial pain and swelling are better now, and resolved.  Has a hard time popping her ears and has sensation of muffled hearing, but no ear pain. No nausea since last office visit. She has been doing Zyrtec. Dizziness is gone completely.   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 She took Meloxicam, did ice and Aleve. She had pain with wide mouth opening. Once before years ago she had that happen to her. She feels that she also had puffiness along the right parotid region. She never felt a lump at the right parotid gland, no paresthesia and no facial muscle weakness.  All of these sx improved at this time after over-the-counter anti-inflammatory medications.    She also reports episodes of nausea/vertigo/off balance sensation lasting for hours. She takes Sudafed OTC and Nasocort and it seems to help, but only for a few days. She has environmental  allergies, on Zyrtec, Nasocort daily. She denies headaches or migraine history. She reports good sense of smell. No recurrent sinus infections. She reports hard time hearing in the noisy environment. 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  No prior hearing tests.  No history of skin cancer or other cancers in the past, no history of radiation, or radioactive iodine, no prior episodes of salivary gland disease.  No prior imaging.  She points at the area of the right SCM cervical spine attachment to the mastoid tip when asked where  the swelling and discomfort are located.     Records Reviewed:  Had MRI breast for 5 mm left lower breast     Past Medical History:  Diagnosis Date   Arthritis    Depression     Past Surgical History:  Procedure Laterality Date   ABDOMINAL HYSTERECTOMY     BREAST LUMPECTOMY WITH RADIOACTIVE SEED LOCALIZATION Left 08/31/2020   Procedure: RADIOACTIVE SEED GUIDED LEFT BREAST LUMPECTOMY;  Surgeon: Griselda Miner, MD;  Location: [facility];  Service: General;  Laterality: Left;   JOINT REPLACEMENT     Hip Right    Family History  Problem Relation Age of Onset   Hypertension Mother    Diabetes Father    Heart disease Father    Breast cancer Maternal Aunt    Pancreatic cancer Maternal Uncle    Breast cancer Maternal Aunt     Social History:  reports that she has never smoked. She has never used smokeless tobacco. She reports current alcohol use. She reports that she does not use drugs.  Allergies:  Allergies  Allergen Reactions   Tape     Rash/blisters    Medications: I have reviewed the patient's current medications.  No results found for this or any previous visit (from the past 48 hour(s)).  No results found.  The PMH, PSH, Medications, Allergies, and SH were reviewed and updated.  ROS: Constitutional: Negative for fever, weight loss and weight gain. Cardiovascular: Negative for chest pain and dyspnea on exertion. Respiratory:  Is not experiencing shortness of breath at rest. Gastrointestinal: Negative for nausea and vomiting. Neurological: Negative for headaches. Psychiatric: The patient is not nervous/anxious  See HPI for pertinent (+)   Blood pressure 103/69, pulse 74, SpO2 97%.  PHYSICAL EXAM:  Exam: General: Well-developed, well-nourished Respiratory Respiratory effort: Equal inspiration and expiration without stridor Cardiovascular Peripheral Vascular: Warm extremities with equal color/perfusion Eyes: No nystagmus with equal  extraocular motion bilaterally Neuro/Psych/Balance: Patient oriented to person, place, and time; Appropriate mood and affect; Gait is intact with no imbalance; Cranial nerves I-XII are intact Head and Face Inspection: Normocephalic and atraumatic without mass or lesion Palpation: Facial skeleton intact without bony stepoffs Salivary Glands: No mass or tenderness Facial Strength: Facial motility symmetric and full bilaterally ENT Pinna: External ear intact and fully developed External canal: Canal is patent with intact skin Tympanic Membrane: Clear and mobile External Nose: No scar or anatomic deformity Internal Nose: Septum intact and midline. No edema, polyp, or rhinorrhea Lips, Teeth, and gums: Mucosa and teeth intact and viable TMJ: No pain to palpation with full mobility Oral cavity/oropharynx: No erythema or exudate, no lesions present Neck Neck and Trachea: Midline trachea without mass or lesion Thyroid: No mass or nodularity Lymphatics: No lymphadenopathy     Studies Reviewed:  CT neck with contrast 02/13/23 FINDINGS: PHARYNX AND LARYNX: The nasopharynx, oropharynx and larynx are normal. Visible portions of the oral cavity, tongue base and floor of mouth are normal. Normal epiglottis, vallecula and pyriform sinuses. The larynx is normal. No retropharyngeal abscess, effusion or lymphadenopathy.   SALIVARY GLANDS: Normal parotid, submandibular and sublingual glands.   THYROID: Normal.   LYMPH NODES: No enlarged or abnormal density lymph nodes.   VASCULAR: Major cervical vessels are patent.   LIMITED INTRACRANIAL: Normal.   VISUALIZED ORBITS: Normal.   MASTOIDS AND VISUALIZED PARANASAL SINUSES: No fluid levels or advanced mucosal thickening. No mastoid effusion.   SKELETON: No bony spinal canal stenosis. No lytic or blastic lesions.   UPPER CHEST: Clear.   OTHER: None.   IMPRESSION: Normal CT of the neck.   Audiogram 01/30/23  Evaluation:  Otoscopy  showed a clear view of the tympanic membranes, bilaterally Tympanometry results were consistent with normal middle ear function, bilaterally   Audiometric testing was completed using conventional audiometry with insert transducer. Speech Recognition Thresholds were 15dB in the right ear and 20 dB in the left ear. Word Recognition was performed 40dB SL, scored 96% in the right ear and 100% in the left ear. Pure tone thresholds show normal hearing bilaterally.    Assessment/Plan: Encounter Diagnoses  Name Primary?   Bilateral temporomandibular joint pain    Environmental and seasonal allergies    Dysfunction of both eustachian tubes    Cervical pain (neck) Yes   Nasal congestion    Atypical migraine     57 yoF history of chronic TMJ issues she manages with NSAIDs and compresses as well as a dental guard at night with complaints including along the attachment of her right SCM at the mastoid tip and along the upper cervical spine area that was palpable on exam without any lymphadenopathy or palpable masses.  She reports swelling along the area of salivary glands on the right side which at this time resolved.  She is also endorsing ear fullness and being unable to pop her ears or hearing underwater.  Has symptoms of seasonal environmental allergies and self treats with over-the-counter antihistamine and nasal steroid spray.  She reports episodes of feeling off balance  and nausea that recently started and usually last for hours.  These episodes improved with rest and over-the-counter Sudafed.  I suspect her.  Parotid and submandibular swelling or either related to sialadenitis versus exacerbation of TMJ and muscle pain along the masticator area.  Her ear symptoms are likely related to chronic nasal congestion and eustachian tube dysfunction cannot rule out other causes at this time.  We will start with CT neck with contrast to exclude salivary gland neoplasm, salivary gland stones and see if she has any  signs of chronic inflammation in the sinuses or concerning findings along the cervical spine.  Will consider dedicated C-spine MRI in the future.  Will likely consider nasal endoscopy when she returns to evaluate for signs of inflammation in the nasal passages.  Will do screening audiogram due to hearing changes.  She was instructed to continue Zyrtec and nasal steroid spray for now.  Will consider neurology referral for atypical migraine workup and management if symptoms will not resolve after optimizing nasal congestion regimen she is on right now.  I discussed with the patient that sensation of feeling off balance and nausea is sometimes related to atypical migraine which increase in prevalence with age especially in female population.  I do not feel that vestibular testing is necessary at this time as vestibular weakness is very low on my differential. Will consider in the future if necessary.  - schedule Audiogram - continue Zyrtec and nasal steroid spray  - schedule CT neck with contrast - return in 6 weeks    Update 03/11/23 I reviewed results of testing with the patient including unremarkable CT neck and audiogram.  I advised her that perhaps she did have salivary gland inflammation but there was no finding on exam to suggest that she has parotid pathology or salivary gland stones.  I advised her that perhaps TMJ syndrome cause some of her symptoms although it is not out of the question that she could have had acute sialadenitis that resolved by the time she had a CT and saw me.  She no longer experiences dizziness and nausea and vomiting, and we again discussed that sometimes cervical spine problems could cause issues with balance and dizziness although this could also be related to other causes.  We also discussed that her symptoms could have been a sign of atypical migraine.  Sensation of muffled hearing is likely due to eustachian tube dysfunction.  I advised the patient to continue Zyrtec and  Flonase.  I also offered nasal endoscopy but at this time she would like to hold off.  We discussed the referral to neurology for atypical migraine workup and an episode of dizziness she had.  At this time she would like to hold off since her symptoms resolved.  She would like to be seen on as-needed basis, I advised her to return if symptoms come back she experiences new symptoms or she develops other ENT related symptoms.  All questions have been answered.  She verbalized understanding.   Thank you for allowing me to participate in the care of this patient. Please do not hesitate to contact me with any questions or concerns.   Ashok Croon, MD Otolaryngology [facility] Phone: [phone] Fax: [phone]    03/11/2023, 6:08 PM
